# Patient Record
Sex: Female | Born: 1970 | Race: White | Hispanic: No | Marital: Married | Smoking: Never smoker
Health system: Southern US, Community
[De-identification: ages and names within clinical notes are randomized; demographics above are authoritative.]

## PROBLEM LIST (undated history)

## (undated) DIAGNOSIS — K219 Gastro-esophageal reflux disease without esophagitis: Secondary | ICD-10-CM

---

## 2007-10-30 ENCOUNTER — Other Ambulatory Visit: Admission: RE | Admit: 2007-10-30 | Discharge: 2007-10-30 | Payer: Self-pay | Admitting: Family Medicine

## 2009-05-17 ENCOUNTER — Encounter: Admission: RE | Admit: 2009-05-17 | Discharge: 2009-05-17 | Payer: Self-pay | Admitting: Obstetrics and Gynecology

## 2012-07-27 ENCOUNTER — Other Ambulatory Visit: Payer: Self-pay | Admitting: Obstetrics and Gynecology

## 2012-07-27 DIAGNOSIS — Z1231 Encounter for screening mammogram for malignant neoplasm of breast: Secondary | ICD-10-CM

## 2012-08-10 ENCOUNTER — Ambulatory Visit
Admission: RE | Admit: 2012-08-10 | Discharge: 2012-08-10 | Disposition: A | Discharge status: Home / Self Care | Payer: Managed Care, Other (non HMO) | Source: Ambulatory Visit | Attending: Obstetrics and Gynecology | Admitting: Obstetrics and Gynecology

## 2012-08-10 DIAGNOSIS — Z1231 Encounter for screening mammogram for malignant neoplasm of breast: Secondary | ICD-10-CM

## 2012-08-12 ENCOUNTER — Other Ambulatory Visit: Payer: Self-pay | Admitting: Obstetrics and Gynecology

## 2012-08-12 DIAGNOSIS — R928 Other abnormal and inconclusive findings on diagnostic imaging of breast: Secondary | ICD-10-CM

## 2012-08-19 ENCOUNTER — Other Ambulatory Visit: Payer: Self-pay | Admitting: Obstetrics and Gynecology

## 2012-08-19 ENCOUNTER — Ambulatory Visit
Admission: RE | Admit: 2012-08-19 | Discharge: 2012-08-19 | Disposition: A | Discharge status: Home / Self Care | Payer: Managed Care, Other (non HMO) | Source: Ambulatory Visit | Attending: Obstetrics and Gynecology | Admitting: Obstetrics and Gynecology

## 2012-08-19 DIAGNOSIS — R928 Other abnormal and inconclusive findings on diagnostic imaging of breast: Secondary | ICD-10-CM

## 2012-08-24 ENCOUNTER — Ambulatory Visit
Admission: RE | Admit: 2012-08-24 | Discharge: 2012-08-24 | Disposition: A | Discharge status: Home / Self Care | Payer: Managed Care, Other (non HMO) | Source: Ambulatory Visit | Attending: Obstetrics and Gynecology | Admitting: Obstetrics and Gynecology

## 2012-08-24 ENCOUNTER — Other Ambulatory Visit: Payer: Self-pay | Admitting: Obstetrics and Gynecology

## 2012-08-24 DIAGNOSIS — R928 Other abnormal and inconclusive findings on diagnostic imaging of breast: Secondary | ICD-10-CM

## 2012-08-24 DIAGNOSIS — R921 Mammographic calcification found on diagnostic imaging of breast: Secondary | ICD-10-CM

## 2013-09-27 ENCOUNTER — Other Ambulatory Visit: Payer: Self-pay

## 2013-09-27 DIAGNOSIS — Z1231 Encounter for screening mammogram for malignant neoplasm of breast: Secondary | ICD-10-CM

## 2013-10-04 ENCOUNTER — Ambulatory Visit
Admission: RE | Admit: 2013-10-04 | Discharge: 2013-10-04 | Disposition: A | Discharge status: Home / Self Care | Payer: Managed Care, Other (non HMO) | Source: Ambulatory Visit

## 2013-10-04 DIAGNOSIS — Z1231 Encounter for screening mammogram for malignant neoplasm of breast: Secondary | ICD-10-CM

## 2015-10-23 ENCOUNTER — Other Ambulatory Visit: Payer: Self-pay

## 2015-10-23 DIAGNOSIS — Z1231 Encounter for screening mammogram for malignant neoplasm of breast: Secondary | ICD-10-CM

## 2015-10-26 ENCOUNTER — Ambulatory Visit
Admission: RE | Admit: 2015-10-26 | Discharge: 2015-10-26 | Disposition: A | Discharge status: Home / Self Care | Payer: Managed Care, Other (non HMO) | Source: Ambulatory Visit

## 2015-10-26 DIAGNOSIS — Z1231 Encounter for screening mammogram for malignant neoplasm of breast: Secondary | ICD-10-CM

## 2015-11-01 ENCOUNTER — Other Ambulatory Visit: Payer: Self-pay | Admitting: Obstetrics and Gynecology

## 2015-11-01 DIAGNOSIS — R928 Other abnormal and inconclusive findings on diagnostic imaging of breast: Secondary | ICD-10-CM

## 2015-11-05 HISTORY — PX: BREAST BIOPSY: SHX20

## 2015-11-07 ENCOUNTER — Other Ambulatory Visit: Payer: Self-pay | Admitting: Obstetrics and Gynecology

## 2015-11-07 ENCOUNTER — Ambulatory Visit
Admission: RE | Admit: 2015-11-07 | Discharge: 2015-11-07 | Disposition: A | Discharge status: Home / Self Care | Payer: Managed Care, Other (non HMO) | Source: Ambulatory Visit | Attending: Obstetrics and Gynecology | Admitting: Obstetrics and Gynecology

## 2015-11-07 DIAGNOSIS — R928 Other abnormal and inconclusive findings on diagnostic imaging of breast: Secondary | ICD-10-CM

## 2015-11-09 ENCOUNTER — Other Ambulatory Visit: Payer: Self-pay | Admitting: Obstetrics and Gynecology

## 2015-11-09 DIAGNOSIS — R928 Other abnormal and inconclusive findings on diagnostic imaging of breast: Secondary | ICD-10-CM

## 2015-11-10 ENCOUNTER — Ambulatory Visit
Admission: RE | Admit: 2015-11-10 | Discharge: 2015-11-10 | Disposition: A | Discharge status: Home / Self Care | Payer: Managed Care, Other (non HMO) | Source: Ambulatory Visit | Attending: Obstetrics and Gynecology | Admitting: Obstetrics and Gynecology

## 2015-11-10 DIAGNOSIS — R928 Other abnormal and inconclusive findings on diagnostic imaging of breast: Secondary | ICD-10-CM

## 2017-04-29 ENCOUNTER — Other Ambulatory Visit: Payer: Self-pay | Admitting: Obstetrics and Gynecology

## 2017-04-29 DIAGNOSIS — Z1231 Encounter for screening mammogram for malignant neoplasm of breast: Secondary | ICD-10-CM

## 2017-05-06 ENCOUNTER — Ambulatory Visit
Admission: RE | Admit: 2017-05-06 | Discharge: 2017-05-06 | Disposition: A | Discharge status: Home / Self Care | Payer: Commercial Managed Care - PPO | Source: Ambulatory Visit | Attending: Obstetrics and Gynecology | Admitting: Obstetrics and Gynecology

## 2017-05-06 DIAGNOSIS — Z1231 Encounter for screening mammogram for malignant neoplasm of breast: Secondary | ICD-10-CM

## 2018-05-14 DIAGNOSIS — R635 Abnormal weight gain: Secondary | ICD-10-CM | POA: Insufficient documentation

## 2018-05-14 DIAGNOSIS — E782 Mixed hyperlipidemia: Secondary | ICD-10-CM | POA: Insufficient documentation

## 2018-06-03 ENCOUNTER — Other Ambulatory Visit: Payer: Self-pay | Admitting: Obstetrics and Gynecology

## 2018-06-03 DIAGNOSIS — Z1231 Encounter for screening mammogram for malignant neoplasm of breast: Secondary | ICD-10-CM

## 2018-06-26 ENCOUNTER — Ambulatory Visit
Admission: RE | Admit: 2018-06-26 | Discharge: 2018-06-26 | Disposition: A | Discharge status: Home / Self Care | Payer: Commercial Managed Care - PPO | Source: Ambulatory Visit | Attending: Obstetrics and Gynecology | Admitting: Obstetrics and Gynecology

## 2018-06-26 DIAGNOSIS — Z1231 Encounter for screening mammogram for malignant neoplasm of breast: Secondary | ICD-10-CM

## 2018-07-30 DIAGNOSIS — R1310 Dysphagia, unspecified: Secondary | ICD-10-CM | POA: Insufficient documentation

## 2018-07-30 DIAGNOSIS — R5383 Other fatigue: Secondary | ICD-10-CM | POA: Insufficient documentation

## 2018-09-30 DIAGNOSIS — E6609 Other obesity due to excess calories: Secondary | ICD-10-CM | POA: Insufficient documentation

## 2019-06-28 ENCOUNTER — Encounter (HOSPITAL_BASED_OUTPATIENT_CLINIC_OR_DEPARTMENT_OTHER): Payer: Self-pay | Admitting: *Deleted

## 2019-06-28 ENCOUNTER — Emergency Department (HOSPITAL_BASED_OUTPATIENT_CLINIC_OR_DEPARTMENT_OTHER)
Admission: EM | Admit: 2019-06-28 | Discharge: 2019-06-28 | Disposition: A | Discharge status: Home / Self Care | Payer: Commercial Managed Care - PPO | Attending: Emergency Medicine | Admitting: Emergency Medicine

## 2019-06-28 ENCOUNTER — Other Ambulatory Visit: Payer: Self-pay

## 2019-06-28 ENCOUNTER — Emergency Department (HOSPITAL_BASED_OUTPATIENT_CLINIC_OR_DEPARTMENT_OTHER): Payer: Commercial Managed Care - PPO

## 2019-06-28 DIAGNOSIS — N83202 Unspecified ovarian cyst, left side: Secondary | ICD-10-CM | POA: Diagnosis not present

## 2019-06-28 DIAGNOSIS — R1032 Left lower quadrant pain: Secondary | ICD-10-CM | POA: Diagnosis present

## 2019-06-28 LAB — URINALYSIS, ROUTINE W REFLEX MICROSCOPIC
Bilirubin Urine: NEGATIVE
Glucose, UA: NEGATIVE mg/dL
Hgb urine dipstick: NEGATIVE
Ketones, ur: NEGATIVE mg/dL
Nitrite: NEGATIVE
Protein, ur: NEGATIVE mg/dL
Specific Gravity, Urine: 1.02 (ref 1.005–1.030)
pH: 7.5 (ref 5.0–8.0)

## 2019-06-28 LAB — BASIC METABOLIC PANEL
Anion gap: 10 (ref 5–15)
BUN: 13 mg/dL (ref 6–20)
CO2: 25 mmol/L (ref 22–32)
Calcium: 9.4 mg/dL (ref 8.9–10.3)
Chloride: 103 mmol/L (ref 98–111)
Creatinine, Ser: 0.68 mg/dL (ref 0.44–1.00)
GFR calc Af Amer: >60 mL/min (ref >60)
GFR calc non Af Amer: >60 mL/min (ref >60)
Glucose, Bld: 98 mg/dL (ref 70–99)
Potassium: 3.8 mmol/L (ref 3.5–5.1)
Sodium: 138 mmol/L (ref 135–145)

## 2019-06-28 LAB — CBC WITH DIFFERENTIAL/PLATELET
Abs Immature Granulocytes: 0.05 10*3/uL (ref 0.00–0.07)
Basophils Absolute: 0.1 10*3/uL (ref 0.0–0.1)
Basophils Relative: 1 %
Eosinophils Absolute: 0.1 10*3/uL (ref 0.0–0.5)
Eosinophils Relative: 1 %
HCT: 43.3 % (ref 36.0–46.0)
Hemoglobin: 14.4 g/dL (ref 12.0–15.0)
Immature Granulocytes: 1 %
Lymphocytes Relative: 17 %
Lymphs Abs: 1.9 10*3/uL (ref 0.7–4.0)
MCH: 29.9 pg (ref 26.0–34.0)
MCHC: 33.3 g/dL (ref 30.0–36.0)
MCV: 90 fL (ref 80.0–100.0)
Monocytes Absolute: 0.5 10*3/uL (ref 0.1–1.0)
Monocytes Relative: 5 %
Neutro Abs: 8.1 10*3/uL — ABNORMAL HIGH (ref 1.7–7.7)
Neutrophils Relative %: 75 %
Platelets: 321 10*3/uL (ref 150–400)
RBC: 4.81 MIL/uL (ref 3.87–5.11)
RDW: 12.8 % (ref 11.5–15.5)
WBC: 10.8 10*3/uL — ABNORMAL HIGH (ref 4.0–10.5)
nRBC: 0 % (ref 0.0–0.2)

## 2019-06-28 LAB — URINALYSIS, MICROSCOPIC (REFLEX)

## 2019-06-28 LAB — HCG, SERUM, QUALITATIVE: Preg, Serum: NEGATIVE

## 2019-06-28 MED ORDER — OXYCODONE-ACETAMINOPHEN 5-325 MG PO TABS
2.0000 | ORAL_TABLET | Freq: Once | ORAL | Status: AC
  Administered 2019-06-28: 2 via ORAL
  Filled 2019-06-28: qty 2

## 2019-06-28 MED ORDER — IOHEXOL 300 MG/ML  SOLN
100.0000 mL | Freq: Once | INTRAMUSCULAR | Status: AC | PRN
  Administered 2019-06-28: 100 mL via INTRAVENOUS

## 2019-06-28 MED ORDER — KETOROLAC TROMETHAMINE 30 MG/ML IJ SOLN
30.0000 mg | Freq: Once | INTRAMUSCULAR | Status: AC
  Administered 2019-06-28: 30 mg via INTRAVENOUS
  Filled 2019-06-28: qty 1

## 2019-06-28 MED ORDER — MORPHINE SULFATE (PF) 4 MG/ML IV SOLN
4.0000 mg | Freq: Once | INTRAVENOUS | Status: AC
  Administered 2019-06-28: 4 mg via INTRAVENOUS
  Filled 2019-06-28: qty 1

## 2019-06-28 MED ORDER — IBUPROFEN 800 MG PO TABS: 800.0000 mg | ORAL_TABLET | Freq: Three times a day (TID) | ORAL | 0 refills | Status: DC

## 2019-06-28 MED ORDER — SODIUM CHLORIDE 0.9 % IV SOLN
Freq: Once | INTRAVENOUS | Status: AC
  Administered 2019-06-28: 22:00:00 via INTRAVENOUS

## 2019-06-28 MED ORDER — OXYCODONE-ACETAMINOPHEN 5-325 MG PO TABS: 1.0000 | ORAL_TABLET | Freq: Four times a day (QID) | ORAL | 0 refills | Status: DC | PRN

## 2019-06-28 NOTE — ED Provider Notes (Signed)
[facility] EMERGENCY DEPARTMENT Provider Note   CSN: [id number] Arrival date & time: 06/28/19  1805     History   Chief Complaint Chief Complaint  Patient presents with   Abdominal Pain    HPI Rhonda Crawford is a 48 y.o. female.     HPI Patient started have left lower abdominal pain this morning.  She reports that at first it was not quite as severe.  However over the course of the day it became much more intense.  It started radiating in her lower left abdomen.  She reports by the time she was driving home from work she was in severe pain.  She reports it made her feel nauseated.  She has not had any diarrhea.  No vomiting.  No abnormal vaginal discharge or bleeding.  Patient denies any history of similar pain.  She felt well before onset. History reviewed. No pertinent past medical history.  There are no active problems to display for this patient.   History reviewed. No pertinent surgical history.   OB History   No obstetric history on file.      Home Medications    Prior to Admission medications   Medication Sig Start Date End Date Taking? Authorizing Provider  ibuprofen (ADVIL) 800 MG tablet Take 1 tablet (800 mg total) by mouth 3 (three) times daily. 06/28/19   Arby BarrettePfeiffer, Roselinda Bahena, MD  omeprazole (PRILOSEC) 20 MG capsule Take by mouth.    [provider]  oxyCODONE-acetaminophen (PERCOCET) 5-325 MG tablet Take 1-2 tablets by mouth every 6 (six) hours as needed. 06/28/19   Arby BarrettePfeiffer, Crawford Tamura, MD    Family History Family History  Problem Relation Age of Onset   Breast cancer Neg Hx     Social History Social History   Tobacco Use   Smoking status: Never Smoker   Smokeless tobacco: Never Used  Substance Use Topics   Alcohol use: Never    Frequency: Never   Drug use: Never     Allergies   Patient has no known allergies.   Review of Systems Review of Systems 10 Systems reviewed and are negative for acute change except as noted in  the HPI.   Physical Exam Updated Vital Signs BP (!) 131/93    Pulse 70    Temp 98.4 F (36.9 C) (Oral)    Resp 16    Ht 5\' 5"  (1.651 m)    Wt 94.8 kg    SpO2 97%    BMI 34.78 kg/m   Physical Exam Constitutional:      Comments: Patient is alert and nontoxic.  She appears uncomfortable.  Mental status clear.  HENT:     Head: Normocephalic and atraumatic.  Eyes:     Extraocular Movements: Extraocular movements intact.     Conjunctiva/sclera: Conjunctivae normal.  Cardiovascular:     Rate and Rhythm: Normal rate and regular rhythm.  Pulmonary:     Effort: Pulmonary effort is normal.     Breath sounds: Normal breath sounds.  Abdominal:     Comments: Abdomen is soft.  Moderate to severe left lower quadrant pain to palpation.  No CVA tenderness.  No guarding.  Musculoskeletal: Normal range of motion.        General: No swelling or tenderness.     Right lower leg: No edema.     Left lower leg: No edema.  Skin:    General: Skin is warm and dry.  Neurological:     General: No focal deficit present.  Mental Status: She is oriented to person, place, and time.     Coordination: Coordination normal.  Psychiatric:        Mood and Affect: Mood normal.      ED Treatments / Results  Labs (all labs ordered are listed, but only abnormal results are displayed) Labs Reviewed  URINALYSIS, ROUTINE W REFLEX MICROSCOPIC - Abnormal; Notable for the following components:      Result Value   APPearance HAZY (*)    Leukocytes,Ua MODERATE (*)    All other components within normal limits  URINALYSIS, MICROSCOPIC (REFLEX) - Abnormal; Notable for the following components:   Bacteria, UA FEW (*)    All other components within normal limits  CBC WITH DIFFERENTIAL/PLATELET - Abnormal; Notable for the following components:   WBC 10.8 (*)    Neutro Abs 8.1 (*)    All other components within normal limits  BASIC METABOLIC PANEL  HCG, SERUM, QUALITATIVE    EKG None  Radiology Ct Abdomen  Pelvis W Contrast  Result Date: 06/28/2019 CLINICAL DATA:  48 year old female with left flank pain. EXAM: CT ABDOMEN AND PELVIS WITH CONTRAST TECHNIQUE: Multidetector CT imaging of the abdomen and pelvis was performed using the standard protocol following bolus administration of intravenous contrast. CONTRAST:  100mL OMNIPAQUE IOHEXOL 300 MG/ML  SOLN COMPARISON:  CT of the abdomen pelvis dated 07/26/2008 FINDINGS: Lower chest: The visualized lung bases are clear. No intra-abdominal free air. There is trace free fluid within the pelvis. Hepatobiliary: No focal liver abnormality is seen. No gallstones, gallbladder wall thickening, or biliary dilatation. Pancreas: Unremarkable. No pancreatic ductal dilatation or surrounding inflammatory changes. Spleen: Normal in size without focal abnormality. Adrenals/Urinary Tract: Adrenal glands are unremarkable. Kidneys are normal, without renal calculi, focal lesion, or hydronephrosis. Bladder is unremarkable. Stomach/Bowel: There is moderate stool throughout the colon. There is no bowel obstruction or active inflammation. The appendix is normal. Vascular/Lymphatic: Minimal atherosclerotic calcification of the aorta. The abdominal aorta and IVC are otherwise unremarkable. No portal venous gas. There is no adenopathy. Reproductive: The uterus is anteverted and appears unremarkable. An intrauterine device is noted. Left ovarian follicles/cysts measure up to 4 cm. There is faint apparent layering of high attenuation along the dependent portion of the dominant follicle, likely blood product or proteinaceous debris. Further evaluation with pelvic ultrasound recommended. The right ovary is grossly unremarkable as visualized. Other: No abdominal wall hernia or abnormality. No abdominopelvic ascites. Musculoskeletal: No acute or significant osseous findings. IMPRESSION: 1. Left ovarian dominant follicle/cyst with probable hemorrhagic or proteinaceous content. Further evaluation pelvic  ultrasound recommended. 2. No bowel obstruction or active inflammation. Normal appendix. 3. Aortic Atherosclerosis (ICD10-I70.0). Electronically Signed   By: Elgie CollardArash  Radparvar M.D.   On: 06/28/2019 22:39    Procedures Procedures (including critical care time)  Medications Ordered in ED Medications  oxyCODONE-acetaminophen (PERCOCET/ROXICET) 5-325 MG per tablet 2 tablet (has no administration in time range)  ketorolac (TORADOL) 30 MG/ML injection 30 mg (30 mg Intravenous Given 06/28/19 2139)  0.9 %  sodium chloride infusion ( Intravenous New Bag/Given 06/28/19 2144)  morphine 4 MG/ML injection 4 mg (4 mg Intravenous Given 06/28/19 2139)  iohexol (OMNIPAQUE) 300 MG/ML solution 100 mL (100 mLs Intravenous Contrast Given 06/28/19 2209)     Initial Impression / Assessment and Plan / ED Course  I have reviewed the triage vital signs and the nursing notes.  Pertinent labs & imaging results that were available during my care of the patient were reviewed by me and considered in my  medical decision making (see chart for details).        Patient had left lower quadrant pain that started in the morning.  Over the course of the day it continued to worsen.  By the time of patient's arrival she was very tender to palpation.  Nausea associated with pain but no significant nausea or vomiting.  She has not had vaginal discharge or bleeding.  Patient reports she does have a Mirena IUD and therefore does not have regular menstrual cycles.  CT scan has identified a left ovarian cyst.  This is consistent with patient's pain presentation.  She has much better pain controlled after Toradol and morphine.  She is otherwise clinically well in appearance.  We will plan to continue treatment for pain.  She is advised to call her GYN physician tomorrow to schedule follow-up within the next 1 to 2 days.  Return precautions reviewed.  Final Clinical Impressions(s) / ED Diagnoses   Final diagnoses:  Cyst of left ovary     ED Discharge Orders         Ordered    ibuprofen (ADVIL) 800 MG tablet  3 times daily     06/28/19 2307    oxyCODONE-acetaminophen (PERCOCET) 5-325 MG tablet  Every 6 hours PRN     06/28/19 2307           Charlesetta Shanks, MD 06/28/19 2312

## 2019-06-28 NOTE — ED Notes (Signed)
Pt. Reports pain started suddenly in the L flank and back area.  Pt. Reports nausea with pain later today.  Pt. Said pain has been coming and going.

## 2019-06-28 NOTE — Discharge Instructions (Signed)
1.  Call your gynecologist in the morning to schedule recheck within the next 1 to 3 days. 2.  Take ibuprofen for pain.  If you need additional pain control you may take 1-2 Percocet every 6 hours. 3.  Return to the emergency department if you develop fever, worsening pain or other concerning symptoms.

## 2019-06-28 NOTE — ED Triage Notes (Signed)
Abdominal pain since this am. Pain is in her left lower quadrant with radiation into her flank.

## 2019-07-06 ENCOUNTER — Emergency Department (HOSPITAL_BASED_OUTPATIENT_CLINIC_OR_DEPARTMENT_OTHER): Payer: Commercial Managed Care - PPO

## 2019-07-06 ENCOUNTER — Other Ambulatory Visit: Payer: Self-pay

## 2019-07-06 ENCOUNTER — Emergency Department (HOSPITAL_BASED_OUTPATIENT_CLINIC_OR_DEPARTMENT_OTHER)
Admission: EM | Admit: 2019-07-06 | Discharge: 2019-07-06 | Disposition: A | Discharge status: Home / Self Care | Payer: Commercial Managed Care - PPO | Attending: Emergency Medicine | Admitting: Emergency Medicine

## 2019-07-06 DIAGNOSIS — R1012 Left upper quadrant pain: Secondary | ICD-10-CM | POA: Diagnosis present

## 2019-07-06 LAB — CBC WITH DIFFERENTIAL/PLATELET
Abs Immature Granulocytes: 0.06 10*3/uL (ref 0.00–0.07)
Basophils Absolute: 0.1 10*3/uL (ref 0.0–0.1)
Basophils Relative: 1 %
Eosinophils Absolute: 0.1 10*3/uL (ref 0.0–0.5)
Eosinophils Relative: 1 %
HCT: 43.4 % (ref 36.0–46.0)
Hemoglobin: 14.4 g/dL (ref 12.0–15.0)
Immature Granulocytes: 1 %
Lymphocytes Relative: 20 %
Lymphs Abs: 2.4 10*3/uL (ref 0.7–4.0)
MCH: 29.9 pg (ref 26.0–34.0)
MCHC: 33.2 g/dL (ref 30.0–36.0)
MCV: 90.2 fL (ref 80.0–100.0)
Monocytes Absolute: 0.6 10*3/uL (ref 0.1–1.0)
Monocytes Relative: 5 %
Neutro Abs: 8.7 10*3/uL — ABNORMAL HIGH (ref 1.7–7.7)
Neutrophils Relative %: 72 %
Platelets: 332 10*3/uL (ref 150–400)
RBC: 4.81 MIL/uL (ref 3.87–5.11)
RDW: 12.9 % (ref 11.5–15.5)
WBC: 11.9 10*3/uL — ABNORMAL HIGH (ref 4.0–10.5)
nRBC: 0 % (ref 0.0–0.2)

## 2019-07-06 LAB — COMPREHENSIVE METABOLIC PANEL
ALT: 25 U/L (ref 0–44)
AST: 22 U/L (ref 15–41)
Albumin: 4.4 g/dL (ref 3.5–5.0)
Alkaline Phosphatase: 54 U/L (ref 38–126)
Anion gap: 10 (ref 5–15)
BUN: 14 mg/dL (ref 6–20)
CO2: 25 mmol/L (ref 22–32)
Calcium: 9.3 mg/dL (ref 8.9–10.3)
Chloride: 102 mmol/L (ref 98–111)
Creatinine, Ser: 0.74 mg/dL (ref 0.44–1.00)
GFR calc Af Amer: >60 mL/min (ref >60)
GFR calc non Af Amer: >60 mL/min (ref >60)
Glucose, Bld: 101 mg/dL — ABNORMAL HIGH (ref 70–99)
Potassium: 3.7 mmol/L (ref 3.5–5.1)
Sodium: 137 mmol/L (ref 135–145)
Total Bilirubin: 0.5 mg/dL (ref 0.3–1.2)
Total Protein: 7.8 g/dL (ref 6.5–8.1)

## 2019-07-06 LAB — LIPASE, BLOOD: Lipase: 27 U/L (ref 11–51)

## 2019-07-06 MED ORDER — KETOROLAC TROMETHAMINE 15 MG/ML IJ SOLN
15.0000 mg | Freq: Once | INTRAMUSCULAR | Status: AC
  Administered 2019-07-06: 21:00:00 15 mg via INTRAVENOUS
  Filled 2019-07-06: qty 1

## 2019-07-06 MED ORDER — MORPHINE SULFATE (PF) 4 MG/ML IV SOLN
4.0000 mg | Freq: Once | INTRAVENOUS | Status: AC
  Administered 2019-07-06: 21:00:00 4 mg via INTRAVENOUS
  Filled 2019-07-06: qty 1

## 2019-07-06 MED ORDER — DICYCLOMINE HCL 10 MG PO CAPS
10.0000 mg | ORAL_CAPSULE | Freq: Once | ORAL | Status: AC
  Administered 2019-07-06: 23:00:00 10 mg via ORAL
  Filled 2019-07-06: qty 1

## 2019-07-06 MED ORDER — SODIUM CHLORIDE 0.9 % IV BOLUS
1000.0000 mL | Freq: Once | INTRAVENOUS | Status: AC
  Administered 2019-07-06: 21:00:00 1000 mL via INTRAVENOUS

## 2019-07-06 MED ORDER — ONDANSETRON HCL 4 MG/2ML IJ SOLN
4.0000 mg | Freq: Once | INTRAMUSCULAR | Status: AC
  Administered 2019-07-06: 21:00:00 4 mg via INTRAVENOUS
  Filled 2019-07-06: qty 2

## 2019-07-06 MED ORDER — DICYCLOMINE HCL 20 MG PO TABS: 20.0000 mg | ORAL_TABLET | Freq: Two times a day (BID) | ORAL | 0 refills | Status: DC

## 2019-07-06 MED ORDER — FAMOTIDINE IN NACL 20-0.9 MG/50ML-% IV SOLN
20.0000 mg | Freq: Once | INTRAVENOUS | Status: AC
  Administered 2019-07-06: 21:00:00 20 mg via INTRAVENOUS
  Filled 2019-07-06: qty 50

## 2019-07-06 MED ORDER — MORPHINE SULFATE (PF) 4 MG/ML IV SOLN
4.0000 mg | Freq: Once | INTRAVENOUS | Status: AC
  Administered 2019-07-06: 4 mg via INTRAVENOUS
  Filled 2019-07-06: qty 1

## 2019-07-06 NOTE — ED Triage Notes (Signed)
Pt c/o left upper abd pain x 2 weeks , NEg [place] today , seen here lats week for same , sent here by PMD today

## 2019-07-06 NOTE — ED Provider Notes (Signed)
[facility] EMERGENCY DEPARTMENT Provider Note   CSN: [id number] Arrival date & time: 07/06/19  1829     History   Chief Complaint Chief Complaint  Patient presents with  . Abdominal Pain    HPI Rhonda Crawford is a 48 y.o. female.     48 yo F with a chief complaints of left-sided abdominal pain.  Going on for about 9 days now.  Pain initially started in the left lower quadrant and now has migrated to the left upper quadrant.  She has had some nausea with this.  Has had some taste of stomach acid in the back of her throat.  She is seen multiple providers for this.  Has been to urgent care in the ED now twice.  Saw her OB/GYN after she was initially diagnosed with a left ovarian cyst and had an outpatient ultrasound that was done today.  The results of that test were benign per the patient.  States that this cyst was visualized with smaller than initially thought.  She felt that her pain has been worsening and so she called her family doctor who had called her in some medicines to the pharmacy.  However because her pain is gotten worse she came to the ED again for evaluation.  Pain now is worse in the left upper quadrant.  Seems to come and go.  Nothing seems to make it better or worse.  Pain radiates to her back as well.  The history is provided by the patient.  Abdominal Pain Pain location:  LUQ Pain quality: cramping, sharp and shooting   Pain radiates to:  LUQ Pain severity:  Severe Onset quality:  Gradual Duration:  9 days Timing:  Intermittent Progression:  Waxing and waning Chronicity:  New Relieved by:  Nothing Worsened by:  Nothing Ineffective treatments:  None tried Associated symptoms: nausea and vomiting   Associated symptoms: no chest pain, no chills, no dysuria, no fever and no shortness of breath     No past medical history on file.  There are no active problems to display for this patient.   No past surgical history on file.   OB History   No  obstetric history on file.      Home Medications    Prior to Admission medications   Medication Sig Start Date End Date Taking? Authorizing Provider  dicyclomine (BENTYL) 20 MG tablet Take 1 tablet (20 mg total) by mouth 2 (two) times daily. 07/06/19   Melene PlanFloyd, Kynzli Rease, DO  ibuprofen (ADVIL) 800 MG tablet Take 1 tablet (800 mg total) by mouth 3 (three) times daily. 06/28/19   Arby BarrettePfeiffer, Marcy, MD  omeprazole (PRILOSEC) 20 MG capsule Take by mouth.    [provider]  oxyCODONE-acetaminophen (PERCOCET) 5-325 MG tablet Take 1-2 tablets by mouth every 6 (six) hours as needed. 06/28/19   Arby BarrettePfeiffer, Marcy, MD    Family History Family History  Problem Relation Age of Onset  . Breast cancer Neg Hx     Social History Social History   Tobacco Use  . Smoking status: Never Smoker  . Smokeless tobacco: Never Used  Substance Use Topics  . Alcohol use: Never    Frequency: Never  . Drug use: Never     Allergies   Patient has no known allergies.   Review of Systems Review of Systems  Constitutional: Negative for chills and fever.  HENT: Negative for congestion and rhinorrhea.   Eyes: Negative for redness and visual disturbance.  Respiratory: Negative for  shortness of breath and wheezing.   Cardiovascular: Negative for chest pain and palpitations.  Gastrointestinal: Positive for abdominal pain, nausea and vomiting.  Genitourinary: Negative for dysuria and urgency.  Musculoskeletal: Negative for arthralgias and myalgias.  Skin: Negative for pallor and wound.  Neurological: Negative for dizziness and headaches.     Physical Exam Updated Vital Signs BP 125/86 (BP Location: Right Arm)   Pulse 60   Temp 98.4 F (36.9 C) (Oral)   Resp 16   Ht 5\' 5"  (1.651 m)   Wt 94 kg   SpO2 99%   BMI 34.49 kg/m   Physical Exam Vitals signs and nursing note reviewed.  Constitutional:      General: She is not in acute distress.    Appearance: She is well-developed. She is not diaphoretic.   HENT:     Head: Normocephalic and atraumatic.  Eyes:     Pupils: Pupils are equal, round, and reactive to light.  Neck:     Musculoskeletal: Normal range of motion and neck supple.  Cardiovascular:     Rate and Rhythm: Normal rate and regular rhythm.     Heart sounds: No murmur. No friction rub. No gallop.   Pulmonary:     Effort: Pulmonary effort is normal.     Breath sounds: No wheezing or rales.  Abdominal:     General: There is no distension.     Palpations: Abdomen is soft.     Tenderness: There is abdominal tenderness.     Comments: She has some mild discomfort in the right upper quadrant.  Pain is worse in the left upper quadrant.  Mild tenderness left lower quadrant.  Musculoskeletal:        General: No tenderness.  Skin:    General: Skin is warm and dry.  Neurological:     Mental Status: She is alert and oriented to person, place, and time.  Psychiatric:        Behavior: Behavior normal.      ED Treatments / Results  Labs (all labs ordered are listed, but only abnormal results are displayed) Labs Reviewed  CBC WITH DIFFERENTIAL/PLATELET - Abnormal; Notable for the following components:      Result Value   WBC 11.9 (*)    Neutro Abs 8.7 (*)    All other components within normal limits  COMPREHENSIVE METABOLIC PANEL - Abnormal; Notable for the following components:   Glucose, Bld 101 (*)    All other components within normal limits  LIPASE, BLOOD    EKG None  Radiology Ct Renal Stone Study  Result Date: 07/06/2019 CLINICAL DATA:  48 year old female with acute abdominal and flank pain. EXAM: CT ABDOMEN AND PELVIS WITHOUT CONTRAST TECHNIQUE: Multidetector CT imaging of the abdomen and pelvis was performed following the standard protocol without IV contrast. COMPARISON:  06/28/2019 CT FINDINGS: Please note that parenchymal abnormalities may be missed without intravenous contrast. Lower chest: No acute abnormality. Hepatobiliary: The liver and gallbladder are  unremarkable. No biliary dilatation. Pancreas: Unremarkable Spleen: Unremarkable Adrenals/Urinary Tract: The kidneys, adrenal glands and bladder are unremarkable. Stomach/Bowel: Stomach is within normal limits. Appendix appears normal. No evidence of bowel wall thickening, distention, or inflammatory changes. Vascular/Lymphatic: Aortic atherosclerosis. No enlarged abdominal or pelvic lymph nodes. Reproductive: A cystic tubular structure in the LEFT adnexal region again noted probably represents a hydrosalpinx or possibly ovarian cysts. Consider ultrasound if symptoms are referable this area. An IUD is noted. A trace amount of free pelvic fluid is noted. Other: No pneumoperitoneum  or abdominal wall hernia. Musculoskeletal: No acute or suspicious bony abnormalities. IMPRESSION: 1. Cystic tubular structure in the LEFT adnexal region again noted probably representing hydrosalpinx or less likely ovarian cysts. Consider pelvic ultrasound if symptoms are referable in this area. 2.  Aortic Atherosclerosis (ICD10-I70.0). Electronically Signed   By: Margarette Canada M.D.   On: 07/06/2019 21:40    Procedures Procedures (including critical care time)  Medications Ordered in ED Medications  sodium chloride 0.9 % bolus 1,000 mL (0 mLs Intravenous Stopped 07/06/19 2205)  ondansetron (ZOFRAN) injection 4 mg (4 mg Intravenous Given 07/06/19 2038)  morphine 4 MG/ML injection 4 mg (4 mg Intravenous Given 07/06/19 2039)  famotidine (PEPCID) IVPB 20 mg premix (0 mg Intravenous Stopped 07/06/19 2205)  ketorolac (TORADOL) 15 MG/ML injection 15 mg (15 mg Intravenous Given 07/06/19 2119)  morphine 4 MG/ML injection 4 mg (4 mg Intravenous Given 07/06/19 2119)  dicyclomine (BENTYL) capsule 10 mg (10 mg Oral Given 07/06/19 2233)     Initial Impression / Assessment and Plan / ED Course  I have reviewed the triage vital signs and the nursing notes.  Pertinent labs & imaging results that were available during my care of the patient were  reviewed by me and considered in my medical decision making (see chart for details).        48 yo F with a chief complaints of left-sided abdominal pain.  Had been seen in the ED couple days ago and had a CT scan that showed a ovarian cyst.  She had an ultrasound done today that showed that it was not complicated.  Her pain is gotten worse.  She had a couple episodes while she was in the room.  Seems like colonic pain as it seems to come and go and in the left upper quadrant.  There is some concern by the patient and the family  physician that maybe this was due to gallbladder pathology.  Will obtain lab work if the liver function tests are unremarkable and I feel the yield of right upper quadrant ultrasound will be low and I will obtain a stone study to evaluate if this is potentially kidney stones.  CT scan unfortunately shows no significant pathology.  It does show the known left-sided ovarian cyst.  There is no signs of rupture that would cause her the have left upper quadrant abdominal pain.  She continues to have colicky pain since she has been here.  Not significantly improved with morphine or Pepcid.  I will start the patient on Bentyl.  We will have her do a trial of MiraLAX for possible constipation.  PCP and GI follow-up.  10:34 PM:  I have discussed the diagnosis/risks/treatment options with the patient and family and believe the pt to be eligible for discharge home to follow-up with PCP, GI. We also discussed returning to the ED immediately if new or worsening sx occur. We discussed the sx which are most concerning (e.g., sudden worsening pain, fever, inability to tolerate by mouth) that necessitate immediate return. Medications administered to the patient during their visit and any new prescriptions provided to the patient are listed below.  Medications given during this visit Medications  sodium chloride 0.9 % bolus 1,000 mL (0 mLs Intravenous Stopped 07/06/19 2205)  ondansetron (ZOFRAN)  injection 4 mg (4 mg Intravenous Given 07/06/19 2038)  morphine 4 MG/ML injection 4 mg (4 mg Intravenous Given 07/06/19 2039)  famotidine (PEPCID) IVPB 20 mg premix (0 mg Intravenous Stopped 07/06/19 2205)  ketorolac (TORADOL)  15 MG/ML injection 15 mg (15 mg Intravenous Given 07/06/19 2119)  morphine 4 MG/ML injection 4 mg (4 mg Intravenous Given 07/06/19 2119)  dicyclomine (BENTYL) capsule 10 mg (10 mg Oral Given 07/06/19 2233)     The patient appears reasonably screen and/or stabilized for discharge and I doubt any other medical condition or other [facility] requiring further screening, evaluation, or treatment in the ED at this time prior to discharge.    Final Clinical Impressions(s) / ED Diagnoses   Final diagnoses:  Colicky LUQ abdominal pain    ED Discharge Orders         Ordered    dicyclomine (BENTYL) 20 MG tablet  2 times daily     07/06/19 2226           Melene PlanFloyd, Cherene Dobbins, DO 07/06/19 2234

## 2019-07-06 NOTE — Discharge Instructions (Signed)
Please follow-up with the gastroenterologist.  Take the medication as prescribed.  Take 8 scoops of miralax in 32oz of whatever you would like to drink.(Gatorade comes in this size) You can also use a fleets enema which you can buy over the counter at the pharmacy.  Return for worsening abdominal pain, vomiting or fever.  Try zantac or pepcid twice a day.  Try to avoid things that may make this worse, most commonly these are spicy foods tomato based products fatty foods chocolate and peppermint.  Alcohol and tobacco can also make this worse.  Return to the emergency department for sudden worsening pain fever or inability to eat or drink.

## 2019-08-31 ENCOUNTER — Other Ambulatory Visit: Payer: Self-pay | Admitting: Obstetrics and Gynecology

## 2019-08-31 DIAGNOSIS — Z1231 Encounter for screening mammogram for malignant neoplasm of breast: Secondary | ICD-10-CM

## 2019-10-19 ENCOUNTER — Other Ambulatory Visit: Payer: Self-pay

## 2019-10-19 ENCOUNTER — Ambulatory Visit
Admission: RE | Admit: 2019-10-19 | Discharge: 2019-10-19 | Disposition: A | Discharge status: Home / Self Care | Payer: Commercial Managed Care - PPO | Source: Ambulatory Visit | Attending: Obstetrics and Gynecology | Admitting: Obstetrics and Gynecology

## 2019-10-19 DIAGNOSIS — Z1231 Encounter for screening mammogram for malignant neoplasm of breast: Secondary | ICD-10-CM

## 2019-10-21 ENCOUNTER — Other Ambulatory Visit: Payer: Self-pay | Admitting: Obstetrics and Gynecology

## 2019-10-21 DIAGNOSIS — R928 Other abnormal and inconclusive findings on diagnostic imaging of breast: Secondary | ICD-10-CM

## 2019-10-27 ENCOUNTER — Other Ambulatory Visit: Payer: Self-pay

## 2019-10-27 ENCOUNTER — Ambulatory Visit
Admission: RE | Admit: 2019-10-27 | Discharge: 2019-10-27 | Disposition: A | Discharge status: Home / Self Care | Payer: Commercial Managed Care - PPO | Source: Ambulatory Visit | Attending: Obstetrics and Gynecology | Admitting: Obstetrics and Gynecology

## 2019-10-27 ENCOUNTER — Other Ambulatory Visit: Payer: Self-pay | Admitting: Obstetrics and Gynecology

## 2019-10-27 DIAGNOSIS — R921 Mammographic calcification found on diagnostic imaging of breast: Secondary | ICD-10-CM

## 2019-10-27 DIAGNOSIS — R928 Other abnormal and inconclusive findings on diagnostic imaging of breast: Secondary | ICD-10-CM

## 2020-06-07 ENCOUNTER — Ambulatory Visit
Admission: RE | Admit: 2020-06-07 | Discharge: 2020-06-07 | Disposition: A | Discharge status: Home / Self Care | Payer: Commercial Managed Care - PPO | Source: Ambulatory Visit | Attending: Obstetrics and Gynecology | Admitting: Obstetrics and Gynecology

## 2020-06-07 ENCOUNTER — Other Ambulatory Visit: Payer: Self-pay | Admitting: Obstetrics and Gynecology

## 2020-06-07 ENCOUNTER — Other Ambulatory Visit: Payer: Self-pay

## 2020-06-07 DIAGNOSIS — R921 Mammographic calcification found on diagnostic imaging of breast: Secondary | ICD-10-CM

## 2020-10-11 ENCOUNTER — Inpatient Hospital Stay: Admission: RE | Admit: 2020-10-11 | Payer: Commercial Managed Care - PPO | Source: Ambulatory Visit

## 2020-11-09 ENCOUNTER — Encounter: Payer: Self-pay | Admitting: Podiatry

## 2020-11-09 ENCOUNTER — Ambulatory Visit (INDEPENDENT_AMBULATORY_CARE_PROVIDER_SITE_OTHER): Payer: Commercial Managed Care - PPO

## 2020-11-09 ENCOUNTER — Ambulatory Visit (INDEPENDENT_AMBULATORY_CARE_PROVIDER_SITE_OTHER): Payer: Commercial Managed Care - PPO | Admitting: Podiatry

## 2020-11-09 ENCOUNTER — Other Ambulatory Visit: Payer: Self-pay

## 2020-11-09 DIAGNOSIS — M722 Plantar fascial fibromatosis: Secondary | ICD-10-CM

## 2020-11-09 MED ORDER — MELOXICAM 15 MG PO TABS: 15.0000 mg | ORAL_TABLET | Freq: Every day | ORAL | 3 refills | Status: DC

## 2020-11-09 MED ORDER — METHYLPREDNISOLONE 4 MG PO TBPK: ORAL_TABLET | ORAL | 0 refills | Status: DC

## 2020-11-09 NOTE — Progress Notes (Signed)
  Subjective:  Patient ID: Rhonda Crawford, female    DOB: 02-23-1971,  MRN: [id number] HPI Chief Complaint  Patient presents with  . Pain    LT bottom heel pain x 3 wks; 10/10 AM pain and 8/10 afternoon pain -no injury -pt states it feels like stepping on an ice stick -pt states she was dx'd with PF 16 yrs ago -w/ swelling and numbness and lateral foot pain Tx: IBU, call rolling and stretching    50 y.o. female presents with the above complaint.   ROS: Denies fever chills nausea vomiting muscle aches pains calf pain back pain chest pain shortness of breath.  History reviewed. No pertinent past medical history. Past Surgical History:  Procedure Laterality Date  . BREAST BIOPSY Right 2017  . BREAST BIOPSY Left 2013    Current Outpatient Medications:  .  meloxicam (MOBIC) 15 MG tablet, Take 1 tablet (15 mg total) by mouth daily., Disp: 30 tablet, Rfl: 3 .  methylPREDNISolone (MEDROL DOSEPAK) 4 MG TBPK tablet, 6 day dose pack - take as directed, Disp: 21 tablet, Rfl: 0 .  omeprazole (PRILOSEC) 20 MG capsule, Take by mouth., Disp: , Rfl:   Allergies  Allergen Reactions  . Lac Bovis Nausea And Vomiting   Review of Systems Objective:  There were no vitals filed for this visit.  General: Well developed, nourished, in no acute distress, alert and oriented x3   Dermatological: Skin is warm, dry and supple bilateral. Nails x 10 are well maintained; remaining integument appears unremarkable at this time. There are no open sores, no preulcerative lesions, no rash or signs of infection present.  Vascular: Dorsalis Pedis artery and Posterior Tibial artery pedal pulses are 2/4 bilateral with immedate capillary fill time. Pedal hair growth present. No varicosities and no lower extremity edema present bilateral.   Neruologic: Grossly intact via light touch bilateral. Vibratory intact via tuning fork bilateral. Protective threshold with Semmes Wienstein monofilament intact to all pedal sites  bilateral. Patellar and Achilles deep tendon reflexes 2+ bilateral. No Babinski or clonus noted bilateral.   Musculoskeletal: No gross boney pedal deformities bilateral. No pain, crepitus, or limitation noted with foot and ankle range of motion bilateral. Muscular strength 5/5 in all groups tested bilateral.  Pain on palpation medial calcaneal tubercle of the left heel.  She has no pain on medial lateral compression of the calcaneus.  Gait: Unassisted, Nonantalgic.    Radiographs:  Radiographs taken today demonstrate an osseously mature individual with soft tissue increase in density at the plantar fascial kidney insertion site.  There is one small area of intrafascial calcification indicative of an old tear.  Assessment & Plan:   Assessment: Plantar fasciitis left.  Plan: Discussed etiology pathology conservative versus surgical therapies.  At this point start her on methylprednisolone to be followed by meloxicam.  I injected her left heel today 20 mg Kenalog 5 mg Marcaine point of maximal tenderness.  Also placed her in a plantar fascial brace and a single night splint.  We discussed appropriate shoe gear stretching exercises ice therapy and shoe gear modifications.  I will follow-up with her in 1 month.     Chistopher Mangino T. [place], [place]

## 2020-11-09 NOTE — Patient Instructions (Signed)

## 2020-11-17 ENCOUNTER — Ambulatory Visit
Admission: RE | Admit: 2020-11-17 | Discharge: 2020-11-17 | Disposition: A | Discharge status: Home / Self Care | Payer: Commercial Managed Care - PPO | Source: Ambulatory Visit | Attending: Obstetrics and Gynecology | Admitting: Obstetrics and Gynecology

## 2020-11-17 ENCOUNTER — Other Ambulatory Visit: Payer: Self-pay

## 2020-11-17 DIAGNOSIS — R921 Mammographic calcification found on diagnostic imaging of breast: Secondary | ICD-10-CM

## 2020-12-12 ENCOUNTER — Ambulatory Visit: Payer: Commercial Managed Care - PPO | Admitting: Podiatry

## 2020-12-19 ENCOUNTER — Other Ambulatory Visit: Payer: Self-pay

## 2020-12-19 ENCOUNTER — Encounter: Payer: Self-pay | Admitting: Podiatry

## 2020-12-19 ENCOUNTER — Ambulatory Visit: Payer: Commercial Managed Care - PPO | Admitting: Podiatry

## 2020-12-19 DIAGNOSIS — M722 Plantar fascial fibromatosis: Secondary | ICD-10-CM | POA: Diagnosis not present

## 2020-12-19 MED ORDER — TRIAMCINOLONE ACETONIDE 40 MG/ML IJ SUSP
20.0000 mg | Freq: Once | INTRAMUSCULAR | Status: AC
  Administered 2020-12-19: 20 mg

## 2021-01-01 ENCOUNTER — Other Ambulatory Visit: Payer: Commercial Managed Care - PPO | Admitting: Orthotics

## 2021-10-27 ENCOUNTER — Ambulatory Visit: Payer: Self-pay

## 2021-11-05 ENCOUNTER — Other Ambulatory Visit: Payer: Self-pay

## 2021-11-05 ENCOUNTER — Ambulatory Visit
Admission: EM | Admit: 2021-11-05 | Discharge: 2021-11-05 | Disposition: A | Discharge status: Home / Self Care | Payer: Commercial Managed Care - PPO | Attending: Physician Assistant | Admitting: Physician Assistant

## 2021-11-05 DIAGNOSIS — J019 Acute sinusitis, unspecified: Secondary | ICD-10-CM | POA: Diagnosis not present

## 2021-11-05 MED ORDER — AMOXICILLIN-POT CLAVULANATE 875-125 MG PO TABS: 1.0000 | ORAL_TABLET | Freq: Two times a day (BID) | ORAL | 0 refills | Status: DC

## 2021-11-05 NOTE — ED Triage Notes (Signed)
Ten day h/o nasal congestion and two day h/o sinus pressure. Has been taking mucinex, Sudafed and other otc meds without relief. Patient complains of her head feeling heavy. Denies v/d, cough and body aches.

## 2021-11-05 NOTE — ED Provider Notes (Signed)
[facility]    CSN: [id number] Arrival date & time: 11/05/21  1146      History   Chief Complaint Chief Complaint  Patient presents with   Nasal Congestion    HPI Rhonda Crawford is a 51 y.o. female.   Patient here today for evaluation of sinus pressure and "heaviness" that started about 2 days ago. She reports about 10 days ago she developed some sinus congestion and drainage. She has not had fever. She denies any cough, body aches, nausea, vomiting or diarrhea. She has tried OTC meds without significant relief.   The history is provided by the patient.   History reviewed. No pertinent past medical history.  Patient Active Problem List   Diagnosis Date Noted   Class 1 obesity due to excess calories with serious comorbidity and body mass index (BMI) of 34.0 to 34.9 in adult 09/30/2018   Other fatigue 07/30/2018   Trouble swallowing 07/30/2018   Mixed hyperlipidemia 05/14/2018   Weight gain 05/14/2018    Past Surgical History:  Procedure Laterality Date   BREAST BIOPSY Right 2017   BREAST BIOPSY Left 2013    OB History   No obstetric history on file.      Home Medications    Prior to Admission medications   Medication Sig Start Date End Date Taking? Authorizing Provider  amoxicillin-clavulanate (AUGMENTIN) 875-125 MG tablet Take 1 tablet by mouth every 12 (twelve) hours. 11/05/21  Yes Tomi Bamberger, PA-C  meloxicam (MOBIC) 15 MG tablet Take 1 tablet (15 mg total) by mouth daily. 11/09/20   Hyatt, Max T, DPM  omeprazole (PRILOSEC) 20 MG capsule Take by mouth.    [provider]    Family History Family History  Problem Relation Age of Onset   Breast cancer Neg Hx     Social History Social History   Tobacco Use   Smoking status: Never   Smokeless tobacco: Never  Substance Use Topics   Alcohol use: Never   Drug use: Never     Allergies   Lac bovis   Review of Systems Review of Systems  Constitutional:  Negative for chills  and fever.  HENT:  Positive for congestion and sinus pressure. Negative for ear pain and sore throat.   Eyes:  Negative for discharge and redness.  Respiratory:  Negative for cough, shortness of breath and wheezing.   Gastrointestinal:  Negative for abdominal pain, diarrhea, nausea and vomiting.    Physical Exam Triage Vital Signs ED Triage Vitals [11/05/21 1304]  Enc Vitals Group     BP 138/84     Pulse Rate 61     Resp 18     Temp 97.7 F (36.5 C)     Temp Source Oral     SpO2 97 %     Weight      Height      Head Circumference      Peak Flow      Pain Score      Pain Loc      Pain Edu?      Excl. in GC?    No data found.  Updated Vital Signs BP 138/84 (BP Location: Left Arm)    Pulse 61    Temp 97.7 F (36.5 C) (Oral)    Resp 18    SpO2 97%     Physical Exam Vitals and nursing note reviewed.  Constitutional:      General: She is not in acute distress.  Appearance: Normal appearance. She is not ill-appearing.  HENT:     Head: Normocephalic and atraumatic.     Nose: Congestion present.     Mouth/Throat:     Mouth: Mucous membranes are moist.     Pharynx: No oropharyngeal exudate or posterior oropharyngeal erythema.  Eyes:     Conjunctiva/sclera: Conjunctivae normal.  Cardiovascular:     Rate and Rhythm: Normal rate and regular rhythm.     Heart sounds: Normal heart sounds. No murmur heard. Pulmonary:     Effort: Pulmonary effort is normal. No respiratory distress.     Breath sounds: Normal breath sounds. No wheezing, rhonchi or rales.  Skin:    General: Skin is warm and dry.  Neurological:     Mental Status: She is alert.  Psychiatric:        Mood and Affect: Mood normal.        Thought Content: Thought content normal.     UC Treatments / Results  Labs (all labs ordered are listed, but only abnormal results are displayed) Labs Reviewed - No data to display  EKG   Radiology No results found.  Procedures Procedures (including critical care  time)  Medications Ordered in UC Medications - No data to display  Initial Impression / Assessment and Plan / UC Course  I have reviewed the triage vital signs and the nursing notes.  Pertinent labs & imaging results that were available during my care of the patient were reviewed by me and considered in my medical decision making (see chart for details).    Suspect acute sinusitis. Will treat with augmentin and recommended follow up with any further concerns.  Final Clinical Impressions(s) / UC Diagnoses   Final diagnoses:  Acute sinusitis, recurrence not specified, unspecified location   Discharge Instructions   None    ED Prescriptions     Medication Sig Dispense Auth. Provider   amoxicillin-clavulanate (AUGMENTIN) 875-125 MG tablet Take 1 tablet by mouth every 12 (twelve) hours. 14 tablet Francene Finders, PA-C      PDMP not reviewed this encounter.   Francene Finders, PA-C 11/05/21 1450

## 2021-12-18 ENCOUNTER — Other Ambulatory Visit: Payer: Self-pay

## 2021-12-18 ENCOUNTER — Ambulatory Visit
Admission: RE | Admit: 2021-12-18 | Discharge: 2021-12-18 | Disposition: A | Discharge status: Home / Self Care | Payer: Commercial Managed Care - PPO | Source: Ambulatory Visit | Attending: Physician Assistant | Admitting: Physician Assistant

## 2021-12-18 VITALS — BP 147/85 | HR 67 | Temp 97.9°F | Resp 18

## 2021-12-18 DIAGNOSIS — J069 Acute upper respiratory infection, unspecified: Secondary | ICD-10-CM | POA: Diagnosis not present

## 2021-12-18 NOTE — ED Provider Notes (Signed)
[facility]    CSN: [id number] Arrival date & time: 12/18/21  1153      History   Chief Complaint Chief Complaint  Patient presents with   Cough   Appointment    1200    HPI Rhonda Crawford is a 51 y.o. female.   Patient here today for evaluation of cough congestion she had the last 4 days.  She reports she did have fever initially.  She has not had any nausea, vomiting or diarrhea.  She does not report treatment for symptoms.  The history is provided by the patient.  Cough Associated symptoms: no chills, no ear pain, no eye discharge, no fever, no shortness of breath, no sore throat and no wheezing    History reviewed. No pertinent past medical history.  Patient Active Problem List   Diagnosis Date Noted   Class 1 obesity due to excess calories with serious comorbidity and body mass index (BMI) of 34.0 to 34.9 in adult 09/30/2018   Other fatigue 07/30/2018   Trouble swallowing 07/30/2018   Mixed hyperlipidemia 05/14/2018   Weight gain 05/14/2018    Past Surgical History:  Procedure Laterality Date   BREAST BIOPSY Right 2017   BREAST BIOPSY Left 2013    OB History   No obstetric history on file.      Home Medications    Prior to Admission medications   Medication Sig Start Date End Date Taking? Authorizing Provider  amoxicillin-clavulanate (AUGMENTIN) 875-125 MG tablet Take 1 tablet by mouth every 12 (twelve) hours. Patient not taking: Reported on 12/18/2021 11/05/21   Tomi Bamberger, PA-C  meloxicam (MOBIC) 15 MG tablet Take 1 tablet (15 mg total) by mouth daily. 11/09/20   Hyatt, Max T, DPM  omeprazole (PRILOSEC) 20 MG capsule Take by mouth.    [provider]    Family History Family History  Problem Relation Age of Onset   Breast cancer Neg Hx     Social History Social History   Tobacco Use   Smoking status: Never   Smokeless tobacco: Never  Substance Use Topics   Alcohol use: Never   Drug use: Never     Allergies    Lac bovis   Review of Systems Review of Systems  Constitutional:  Negative for chills and fever.  HENT:  Positive for congestion. Negative for ear pain and sore throat.   Eyes:  Negative for discharge and redness.  Respiratory:  Positive for cough. Negative for shortness of breath and wheezing.   Gastrointestinal:  Negative for abdominal pain, diarrhea, nausea and vomiting.    Physical Exam Triage Vital Signs ED Triage Vitals [12/18/21 1215]  Enc Vitals Group     BP (!) 147/85     Pulse Rate 67     Resp 18     Temp 97.9 F (36.6 C)     Temp Source Oral     SpO2 95 %     Weight      Height      Head Circumference      Peak Flow      Pain Score 5     Pain Loc      Pain Edu?      Excl. in GC?    No data found.  Updated Vital Signs BP (!) 147/85 (BP Location: Left Arm)    Pulse 67    Temp 97.9 F (36.6 C) (Oral)    Resp 18    SpO2 95%  Physical Exam Vitals and nursing note reviewed.  Constitutional:      General: She is not in acute distress.    Appearance: Normal appearance. She is not ill-appearing.  HENT:     Head: Normocephalic and atraumatic.     Right Ear: Tympanic membrane normal.     Left Ear: Tympanic membrane normal.     Nose: Congestion present.     Mouth/Throat:     Mouth: Mucous membranes are moist.     Pharynx: No oropharyngeal exudate or posterior oropharyngeal erythema.  Eyes:     Conjunctiva/sclera: Conjunctivae normal.  Cardiovascular:     Rate and Rhythm: Normal rate and regular rhythm.     Heart sounds: Normal heart sounds. No murmur heard. Pulmonary:     Effort: Pulmonary effort is normal. No respiratory distress.     Breath sounds: Normal breath sounds. No wheezing, rhonchi or rales.  Skin:    General: Skin is warm and dry.  Neurological:     Mental Status: She is alert.  Psychiatric:        Mood and Affect: Mood normal.        Thought Content: Thought content normal.     UC Treatments / Results  Labs (all labs ordered  are listed, but only abnormal results are displayed) Labs Reviewed  COVID-19, FLU A+B NAA    EKG   Radiology No results found.  Procedures Procedures (including critical care time)  Medications Ordered in UC Medications - No data to display  Initial Impression / Assessment and Plan / UC Course  I have reviewed the triage vital signs and the nursing notes.  Pertinent labs & imaging results that were available during my care of the patient were reviewed by me and considered in my medical decision making (see chart for details).  Suspect likely viral etiology of symptoms.  Will order COVID screening and encouraged symptomatic treatment, increase fluids and rest.  Encouraged follow-up with any further concerns.  Final Clinical Impressions(s) / UC Diagnoses   Final diagnoses:  Acute upper respiratory infection   Discharge Instructions   None    ED Prescriptions   None    PDMP not reviewed this encounter.   Tomi Bamberger, PA-C 12/18/21 1251

## 2021-12-18 NOTE — ED Triage Notes (Signed)
Pt here for cough and congestion x 4 days; pt sts some fever

## 2021-12-19 LAB — COVID-19, FLU A+B NAA
Influenza A, NAA: NOT DETECTED
Influenza B, NAA: NOT DETECTED
SARS-CoV-2, NAA: NOT DETECTED

## 2022-03-22 ENCOUNTER — Ambulatory Visit (INDEPENDENT_AMBULATORY_CARE_PROVIDER_SITE_OTHER): Payer: Commercial Managed Care - PPO

## 2022-03-22 ENCOUNTER — Ambulatory Visit
Admission: RE | Admit: 2022-03-22 | Discharge: 2022-03-22 | Disposition: A | Discharge status: Home / Self Care | Payer: Commercial Managed Care - PPO | Source: Ambulatory Visit | Attending: Internal Medicine | Admitting: Internal Medicine

## 2022-03-22 VITALS — BP 138/90 | HR 62 | Temp 97.6°F | Resp 18

## 2022-03-22 DIAGNOSIS — R053 Chronic cough: Secondary | ICD-10-CM

## 2022-03-22 DIAGNOSIS — J3489 Other specified disorders of nose and nasal sinuses: Secondary | ICD-10-CM | POA: Diagnosis not present

## 2022-03-22 DIAGNOSIS — R0981 Nasal congestion: Secondary | ICD-10-CM | POA: Diagnosis not present

## 2022-03-22 DIAGNOSIS — R059 Cough, unspecified: Secondary | ICD-10-CM | POA: Diagnosis not present

## 2022-03-22 HISTORY — DX: Gastro-esophageal reflux disease without esophagitis: K21.9

## 2022-03-22 MED ORDER — DOXYCYCLINE HYCLATE 100 MG PO CAPS
100.0000 mg | ORAL_CAPSULE | Freq: Two times a day (BID) | ORAL | 0 refills | Status: DC
Start: 2022-03-22 — End: 2024-02-20

## 2022-03-22 MED ORDER — PREDNISONE 10 MG (21) PO TBPK: ORAL_TABLET | Freq: Every day | ORAL | 0 refills | Status: DC

## 2022-03-22 NOTE — ED Triage Notes (Signed)
Pt c/o cough, sinus pressure, onset ~ several months ago. States since Tuesday it has been getting worse. Associated night sweats. States she has been seen numerous times for this problem.

## 2022-03-22 NOTE — ED Provider Notes (Signed)
[facility]    CSN: [id number] Arrival date & time: 03/22/22  1649      History   Chief Complaint Chief Complaint  Patient presents with   Fever    CoughSinus pressureDrainageHad cough for months- was seen for it awhile ago and hasn't gone away - Entered by patient   Cough    HPI Rhonda Crawford is a 51 y.o. female.   Patient presents with a cough, sinus pressure, nasal drainage, nasal congestion that has been present for approximately 5 months.  Patient reports that symptoms will improved but then will come back even worse.  She reports that her cough has been productive and has worsened over the past few days.  She has also felt feverish but has not had a documented fever.  Patient was recently seen in January 2023 when symptoms first started and treated with Augmentin antibiotic with no improvement.  She reports that she has been taking several over-the-counter cough and cold medications with no improvement in symptoms.  Denies history of asthma or COPD and patient is not a smoker.   Fever Cough  Past Medical History:  Diagnosis Date   GERD (gastroesophageal reflux disease)     Patient Active Problem List   Diagnosis Date Noted   Class 1 obesity due to excess calories with serious comorbidity and body mass index (BMI) of 34.0 to 34.9 in adult 09/30/2018   Other fatigue 07/30/2018   Trouble swallowing 07/30/2018   Mixed hyperlipidemia 05/14/2018   Weight gain 05/14/2018    Past Surgical History:  Procedure Laterality Date   BREAST BIOPSY Right 2017   BREAST BIOPSY Left 2013    OB History   No obstetric history on file.      Home Medications    Prior to Admission medications   Medication Sig Start Date End Date Taking? Authorizing Provider  doxycycline (VIBRAMYCIN) 100 MG capsule Take 1 capsule (100 mg total) by mouth 2 (two) times daily. 03/22/22  Yes Marya Lowden, Rolly Salter E, FNP  predniSONE (STERAPRED UNI-PAK 21 TAB) 10 MG (21) TBPK tablet Take by  mouth daily. Take 6 tabs by mouth daily  for 2 days, then 5 tabs for 2 days, then 4 tabs for 2 days, then 3 tabs for 2 days, 2 tabs for 2 days, then 1 tab by mouth daily for 2 days 03/22/22  Yes Maleiah Dula, Ardoch E, FNP  meloxicam (MOBIC) 15 MG tablet Take 1 tablet (15 mg total) by mouth daily. 11/09/20   Hyatt, Max T, DPM  omeprazole (PRILOSEC) 20 MG capsule Take by mouth.    [provider]    Family History Family History  Problem Relation Age of Onset   Breast cancer Neg Hx     Social History Social History   Tobacco Use   Smoking status: Never   Smokeless tobacco: Never  Substance Use Topics   Alcohol use: Never   Drug use: Never     Allergies   Lac bovis   Review of Systems Review of Systems Per HPI  Physical Exam Triage Vital Signs ED Triage Vitals  Enc Vitals Group     BP 03/22/22 1757 138/90     Pulse Rate 03/22/22 1757 62     Resp 03/22/22 1757 18     Temp 03/22/22 1757 97.6 F (36.4 C)     Temp Source 03/22/22 1757 Oral     SpO2 03/22/22 1757 95 %     Weight --  Height --      Head Circumference --      Peak Flow --      Pain Score 03/22/22 1801 0     Pain Loc --      Pain Edu? --      Excl. in GC? --    No data found.  Updated Vital Signs BP 138/90 (BP Location: Right Arm)   Pulse 62   Temp 97.6 F (36.4 C) (Oral)   Resp 18   SpO2 95%   Visual Acuity Right Eye Distance:   Left Eye Distance:   Bilateral Distance:    Right Eye Near:   Left Eye Near:    Bilateral Near:     Physical Exam Constitutional:      General: She is not in acute distress.    Appearance: Normal appearance. She is not toxic-appearing or diaphoretic.  HENT:     Head: Normocephalic and atraumatic.     Right Ear: Tympanic membrane and ear canal normal.     Left Ear: Tympanic membrane and ear canal normal.     Nose: Congestion present.     Mouth/Throat:     Mouth: Mucous membranes are moist.     Pharynx: No posterior oropharyngeal erythema.  Eyes:      Extraocular Movements: Extraocular movements intact.     Conjunctiva/sclera: Conjunctivae normal.     Pupils: Pupils are equal, round, and reactive to light.  Cardiovascular:     Rate and Rhythm: Normal rate and regular rhythm.     Pulses: Normal pulses.     Heart sounds: Normal heart sounds.  Pulmonary:     Effort: Pulmonary effort is normal. No respiratory distress.     Breath sounds: Normal breath sounds. No stridor. No wheezing, rhonchi or rales.  Abdominal:     General: Abdomen is flat. Bowel sounds are normal.     Palpations: Abdomen is soft.  Musculoskeletal:        General: Normal range of motion.     Cervical back: Normal range of motion.  Skin:    General: Skin is warm and dry.  Neurological:     General: No focal deficit present.     Mental Status: She is alert and oriented to person, place, and time. Mental status is at baseline.  Psychiatric:        Mood and Affect: Mood normal.        Behavior: Behavior normal.     UC Treatments / Results  Labs (all labs ordered are listed, but only abnormal results are displayed) Labs Reviewed - No data to display  EKG   Radiology DG Chest 2 View  Result Date: 03/22/2022 CLINICAL DATA:  Cough EXAM: CHEST - 2 VIEW COMPARISON:  None Available. FINDINGS: Cardiac and mediastinal contours are within normal limits. No focal pulmonary opacity. No pleural effusion or pneumothorax. No acute osseous abnormality. IMPRESSION: No acute cardiopulmonary process. Electronically Signed   By: Wiliam KeAlison  Vasan M.D.   On: 03/22/2022 18:32    Procedures Procedures (including critical care time)  Medications Ordered in UC Medications - No data to display  Initial Impression / Assessment and Plan / UC Course  I have reviewed the triage vital signs and the nursing notes.  Pertinent labs & imaging results that were available during my care of the patient were reviewed by me and considered in my medical decision making (see chart for details).      Chest x-ray was negative for any acute cardiopulmonary  process.  Suspect persistent sinus infection and sinus related issues.  Will treat with doxycycline and prednisone steroid to alleviate inflammation and any type of bacterial infection.  No obvious contraindications to prednisone noted on exam or after review of chart.  She has taken prednisone in the past and tolerated well.  I do think that patient needs to see ENT specialty given persistent symptoms.  Provided with contact information for ENT and patient advised to follow-up.  Discussed return precautions.  Patient verbalized understanding and was agreeable with plan. Final Clinical Impressions(s) / UC Diagnoses   Final diagnoses:  Sinus pressure  Nasal congestion  Persistent cough for 3 weeks or longer     Discharge Instructions      Your chest x-ray was normal.  You have been prescribed antibiotic and prednisone steroid to alleviate sinus pressure, cough, symptoms.  Please follow-up with ear, nose, throat specialist on Monday for further evaluation and management.     ED Prescriptions     Medication Sig Dispense Auth. Provider   doxycycline (VIBRAMYCIN) 100 MG capsule Take 1 capsule (100 mg total) by mouth 2 (two) times daily. 20 capsule [place], [place], [place]   predniSONE (STERAPRED UNI-PAK 21 TAB) 10 MG (21) TBPK tablet Take by mouth daily. Take 6 tabs by mouth daily  for 2 days, then 5 tabs for 2 days, then 4 tabs for 2 days, then 3 tabs for 2 days, 2 tabs for 2 days, then 1 tab by mouth daily for 2 days 42 tablet [place], [place], [place]      PDMP not reviewed this encounter.   Gustavus Bryant, [place] 03/22/22 [phone]

## 2022-03-22 NOTE — Discharge Instructions (Signed)
Your chest x-ray was normal.  You have been prescribed antibiotic and prednisone steroid to alleviate sinus pressure, cough, symptoms.  Please follow-up with ear, nose, throat specialist on Monday for further evaluation and management.

## 2022-05-15 ENCOUNTER — Other Ambulatory Visit: Payer: Self-pay | Admitting: Obstetrics and Gynecology

## 2022-05-15 DIAGNOSIS — Z09 Encounter for follow-up examination after completed treatment for conditions other than malignant neoplasm: Secondary | ICD-10-CM

## 2022-05-15 DIAGNOSIS — R921 Mammographic calcification found on diagnostic imaging of breast: Secondary | ICD-10-CM

## 2022-05-29 ENCOUNTER — Ambulatory Visit
Admission: RE | Admit: 2022-05-29 | Discharge: 2022-05-29 | Disposition: A | Discharge status: Home / Self Care | Payer: Commercial Managed Care - PPO | Source: Ambulatory Visit | Attending: Obstetrics and Gynecology | Admitting: Obstetrics and Gynecology

## 2022-05-29 DIAGNOSIS — R921 Mammographic calcification found on diagnostic imaging of breast: Secondary | ICD-10-CM

## 2022-05-29 DIAGNOSIS — Z09 Encounter for follow-up examination after completed treatment for conditions other than malignant neoplasm: Secondary | ICD-10-CM

## 2022-11-26 IMAGING — MG DIGITAL DIAGNOSTIC BILAT W/ TOMO W/ CAD
6 of 10 series · 6 of 26 positions shown · non-contrast
Comparison: Previous exams including diagnostic mammograms dated
06/07/2020 and 10/27/2019.

CLINICAL DATA: Recall from screening mammogram in Saturday October, 2019
for calcifications in the upper-outer quadrant of the RIGHT breast
at posterior depth. Prior benign stereotactic biopsy of
calcifications in the upper-outer RIGHT breast with pathology
revealing fibrocystic changes and associated calcifications.

EXAM:
DIGITAL DIAGNOSTIC BILATERAL MAMMOGRAM WITH TOMO AND CAD

[R ML]
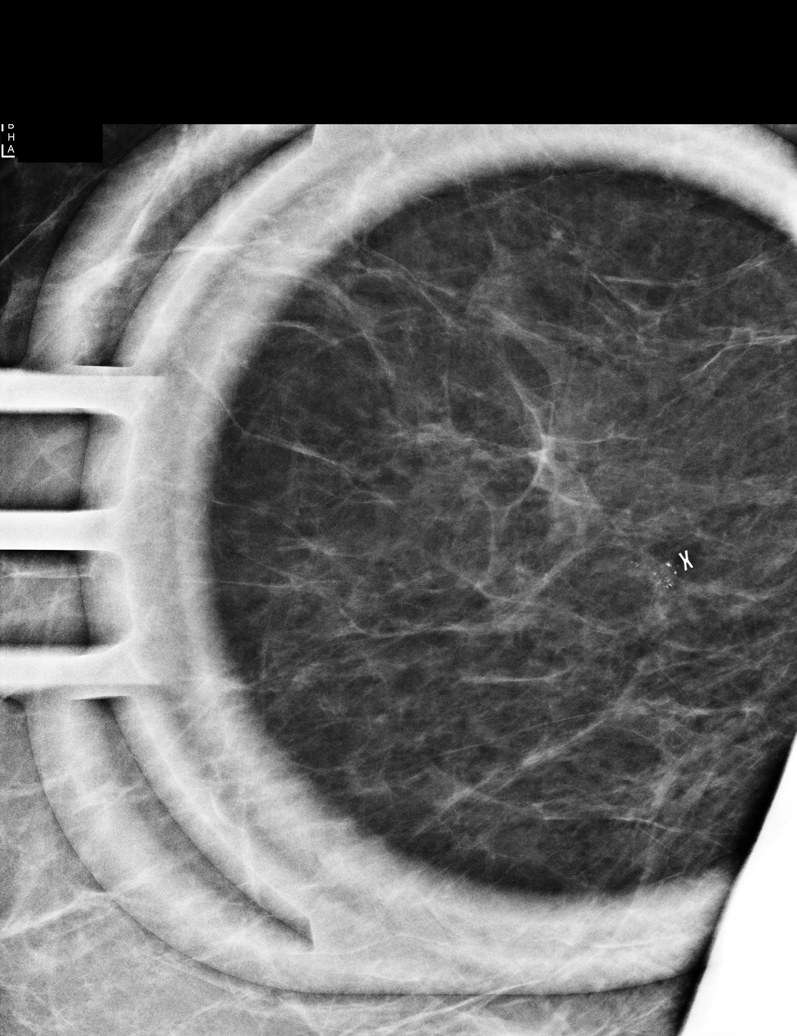

[R CC]
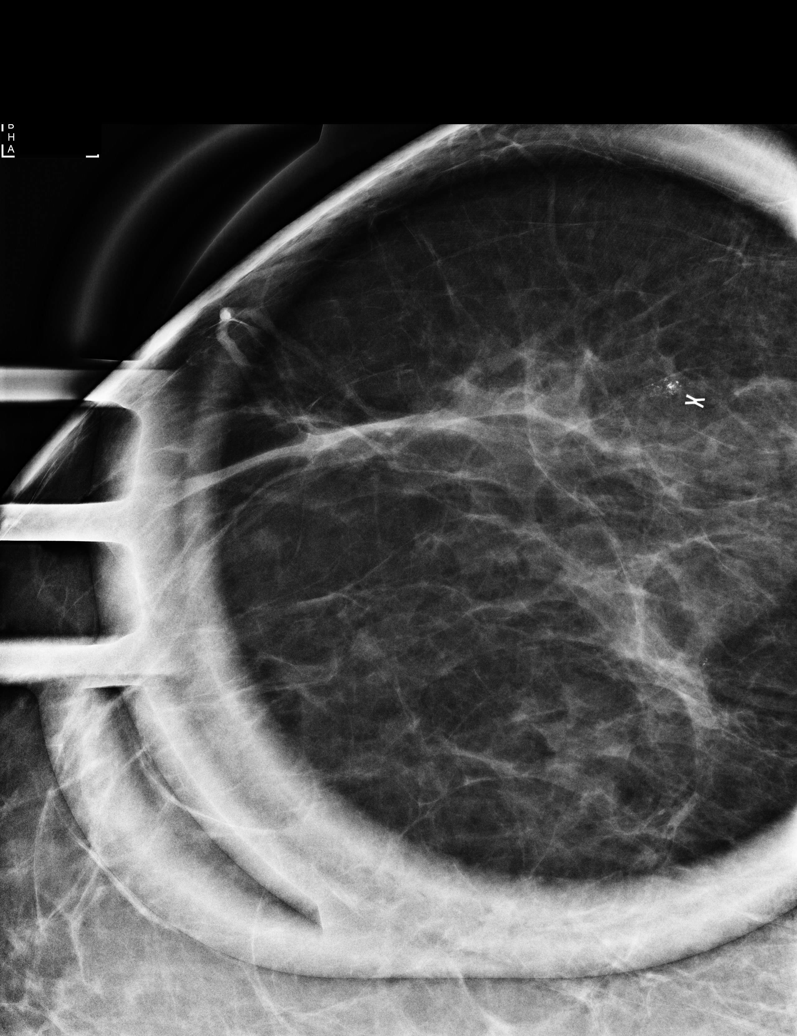

[R CC synth-2D]
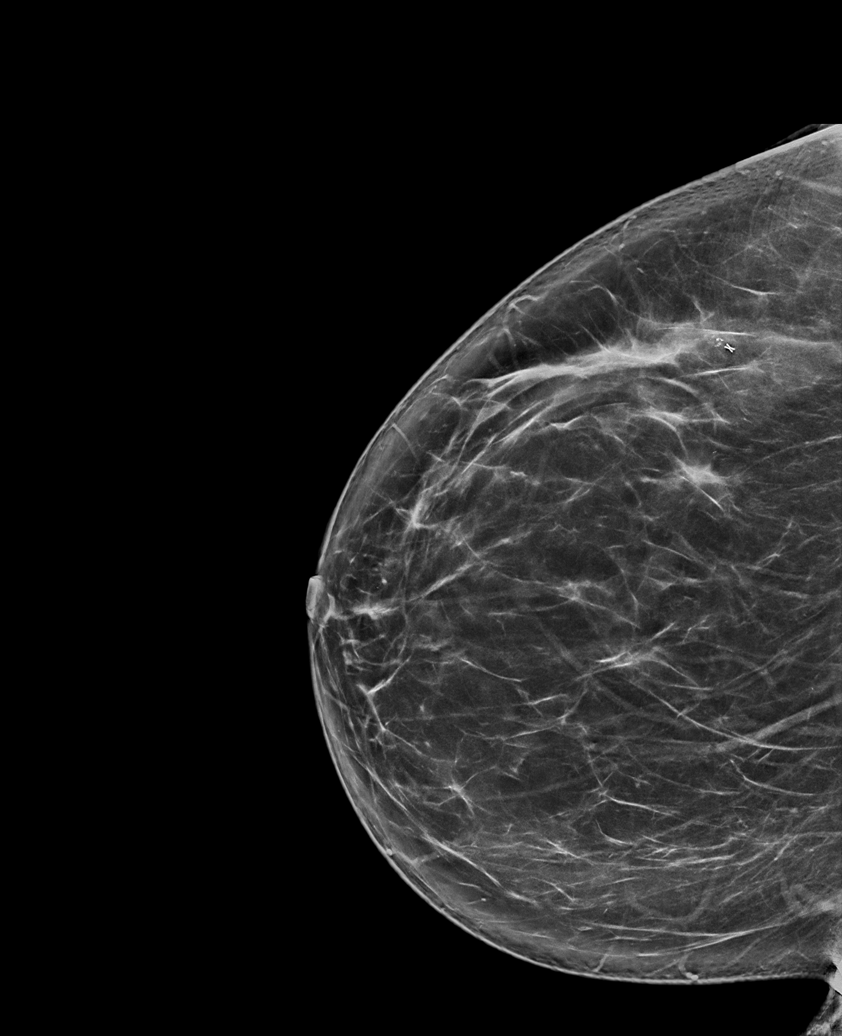

[R MLO synth-2D]
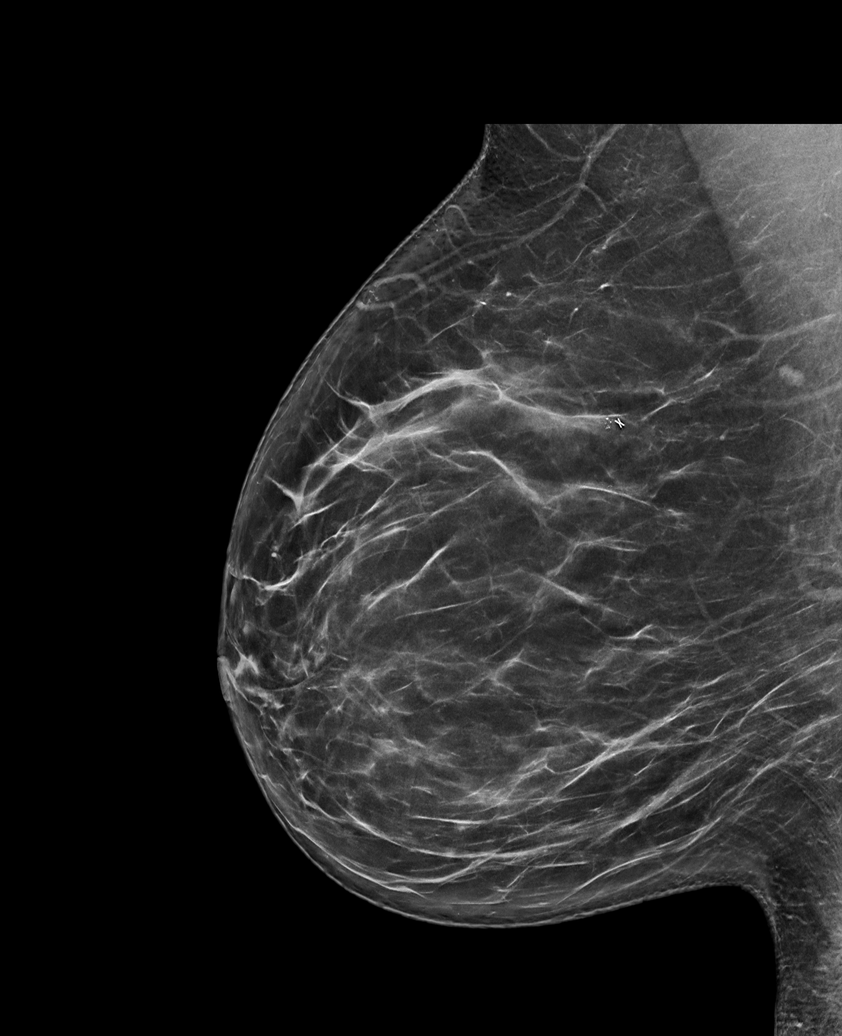

[L MLO synth-2D]
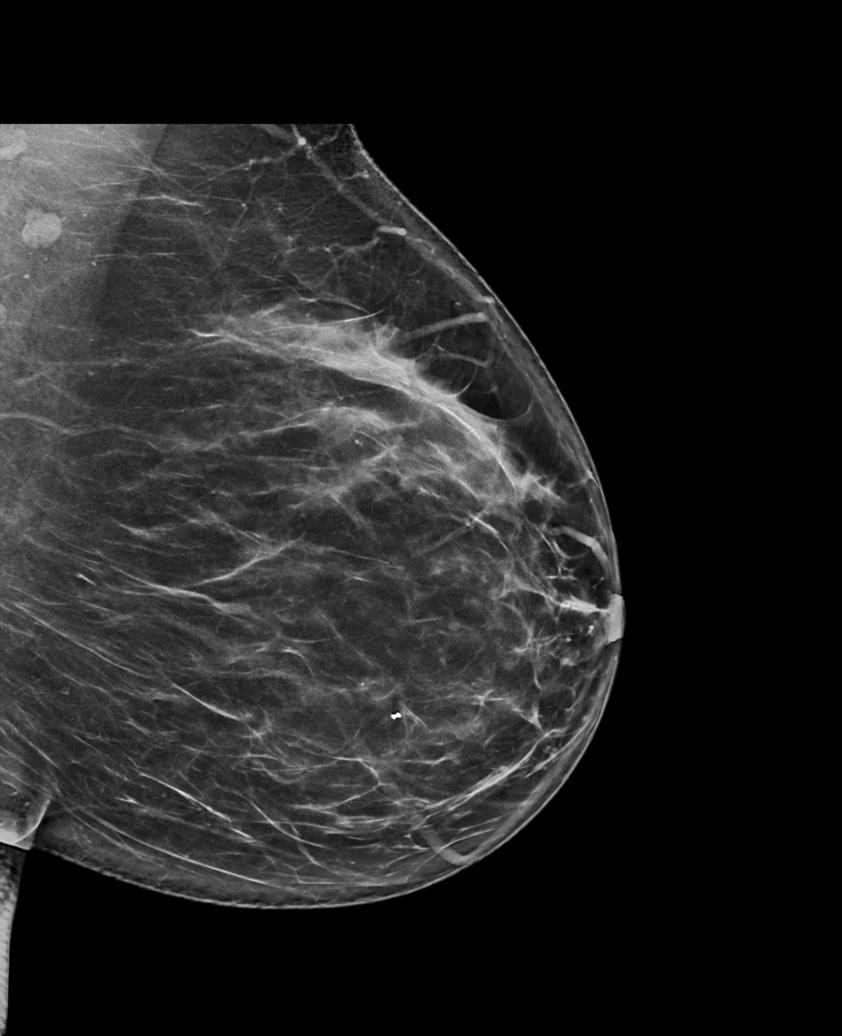

[L CC synth-2D]
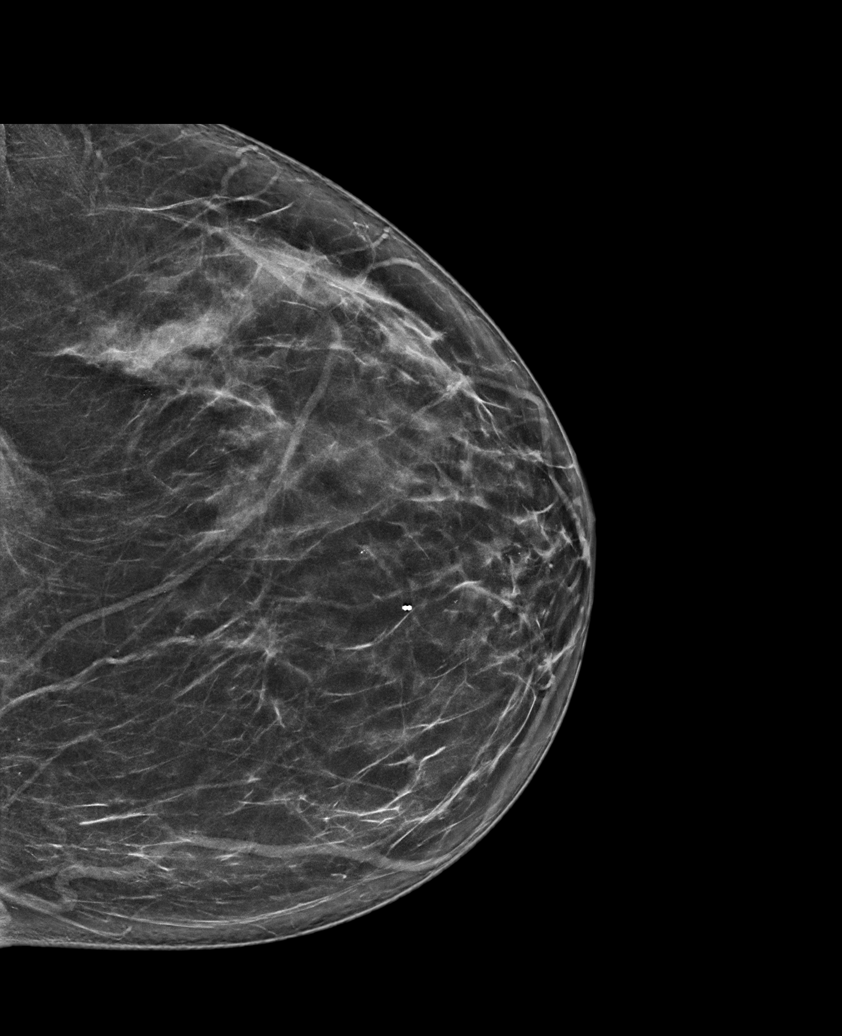

[6 of 26 positions shown; findings below may reference images not displayed]

ACR Breast Density Category b: There are scattered areas of
fibroglandular density.
FINDINGS: The RIGHT breast calcifications are not significantly changed, with
adjacent X shaped biopsy clip. There are no new dominant masses,
suspicious calcifications or secondary signs of malignancy within
either breast.

Mammographic images were processed with CAD.
IMPRESSION: 1. Probably benign calcifications within the upper-outer quadrant of
the RIGHT breast, at posterior depth, with adjacent X shaped biopsy
clip corresponding to an earlier benign biopsy site.
2. No evidence of malignancy within the LEFT breast.

RECOMMENDATION:
Bilateral diagnostic mammogram, with RIGHT breast magnification
views, in 12 months.

I have discussed the findings and recommendations with the patient.
If applicable, a reminder letter will be sent to the patient
regarding the next appointment.

BI-RADS CATEGORY  3: Probably benign.

## 2023-10-24 ENCOUNTER — Other Ambulatory Visit: Payer: Self-pay | Admitting: Endocrinology

## 2023-10-24 DIAGNOSIS — Z1231 Encounter for screening mammogram for malignant neoplasm of breast: Secondary | ICD-10-CM

## 2023-11-14 ENCOUNTER — Ambulatory Visit: Payer: Commercial Managed Care - PPO

## 2023-11-17 ENCOUNTER — Ambulatory Visit
Admission: RE | Admit: 2023-11-17 | Discharge: 2023-11-17 | Disposition: A | Discharge status: Home / Self Care | Payer: Commercial Managed Care - PPO | Source: Ambulatory Visit | Attending: Endocrinology | Admitting: Endocrinology

## 2023-11-17 DIAGNOSIS — Z1231 Encounter for screening mammogram for malignant neoplasm of breast: Secondary | ICD-10-CM

## 2023-11-19 ENCOUNTER — Other Ambulatory Visit: Payer: Self-pay | Admitting: Endocrinology

## 2023-11-19 DIAGNOSIS — R928 Other abnormal and inconclusive findings on diagnostic imaging of breast: Secondary | ICD-10-CM

## 2023-11-27 ENCOUNTER — Ambulatory Visit: Payer: Commercial Managed Care - PPO

## 2023-11-27 ENCOUNTER — Ambulatory Visit
Admission: RE | Admit: 2023-11-27 | Discharge: 2023-11-27 | Disposition: A | Discharge status: Home / Self Care | Payer: Commercial Managed Care - PPO | Source: Ambulatory Visit | Attending: Endocrinology | Admitting: Endocrinology

## 2023-11-27 DIAGNOSIS — R928 Other abnormal and inconclusive findings on diagnostic imaging of breast: Secondary | ICD-10-CM

## 2024-02-20 ENCOUNTER — Ambulatory Visit
Admission: RE | Admit: 2024-02-20 | Discharge: 2024-02-20 | Disposition: A | Discharge status: Home / Self Care | Source: Ambulatory Visit

## 2024-02-20 VITALS — BP 143/88 | HR 76 | Temp 98.5°F | Resp 16

## 2024-02-20 DIAGNOSIS — J069 Acute upper respiratory infection, unspecified: Secondary | ICD-10-CM

## 2024-02-20 DIAGNOSIS — R194 Change in bowel habit: Secondary | ICD-10-CM | POA: Insufficient documentation

## 2024-02-20 DIAGNOSIS — R053 Chronic cough: Secondary | ICD-10-CM | POA: Diagnosis not present

## 2024-02-20 DIAGNOSIS — R0781 Pleurodynia: Secondary | ICD-10-CM | POA: Insufficient documentation

## 2024-02-20 DIAGNOSIS — R1032 Left lower quadrant pain: Secondary | ICD-10-CM | POA: Insufficient documentation

## 2024-02-20 MED ORDER — BENZONATATE 100 MG PO CAPS: 100.0000 mg | ORAL_CAPSULE | Freq: Three times a day (TID) | ORAL | 0 refills | Status: DC | PRN

## 2024-02-20 MED ORDER — AMOXICILLIN-POT CLAVULANATE 875-125 MG PO TABS: 1.0000 | ORAL_TABLET | Freq: Two times a day (BID) | ORAL | 0 refills | Status: DC

## 2024-02-20 MED ORDER — PREDNISONE 10 MG (21) PO TBPK: ORAL_TABLET | Freq: Every day | ORAL | 0 refills | Status: DC

## 2024-02-20 NOTE — ED Triage Notes (Signed)
 Pt c/o cough x 2 weeks. She has treated it with Sudafed and Mucinex with no improvement in sxs. Pt denies emesis and diarrhea.

## 2024-02-20 NOTE — Discharge Instructions (Addendum)
 I have prescribe an antibiotic and steroid to take for symptoms.  Please take with food to avoid stomach upset and follow-up if any symptoms persist or worsen.

## 2024-02-20 NOTE — ED Provider Notes (Signed)
 [facility]    CSN: [id number] Arrival date & time: 02/20/24  1532      History   Chief Complaint Chief Complaint  Patient presents with   Cough    Congestion and cough . 2 weeks - Entered by patient    HPI Rhonda Crawford is a 53 y.o. female.   Patient presents with cough that started 2 weeks ago.  Reports it is mainly dry but productive at times.  Also reports nasal congestion but denies sinus pressure.  Reports that she has felt feverish but has not taken temperature with thermometer.  Denies any known sick contacts.  This is the first time she has been evaluated since symptoms started.  Has taken Sudafed and Mucinex with no improvement in symptoms.  She is not reporting any chest pain or shortness of breath.   Cough   Past Medical History:  Diagnosis Date   GERD (gastroesophageal reflux disease)     Patient Active Problem List   Diagnosis Date Noted   Change in bowel habit 02/20/2024   Left lower quadrant pain 02/20/2024   Rib pain 02/20/2024   Class 1 obesity due to excess calories with serious comorbidity and body mass index (BMI) of 34.0 to 34.9 in adult 09/30/2018   Other fatigue 07/30/2018   Trouble swallowing 07/30/2018   Mixed hyperlipidemia 05/14/2018   Weight gain 05/14/2018    Past Surgical History:  Procedure Laterality Date   BREAST BIOPSY Right 2017   BREAST BIOPSY Left 2013    OB History   No obstetric history on file.      Home Medications    Prior to Admission medications   Medication Sig Start Date End Date Taking? Authorizing Provider  amoxicillin -clavulanate (AUGMENTIN ) 875-125 MG tablet Take 1 tablet by mouth every 12 (twelve) hours. 02/20/24  Yes Rahmon Heigl, [place]y [place] E, FNP  atorvastatin (LIPITOR) 20 MG tablet Take 20 mg by mouth daily. 02/06/24  Yes [provider]  benzonatate  (TESSALON ) 100 MG capsule Take 1 capsule (100 mg total) by mouth every 8 (eight) hours as needed for cough. 02/20/24  Yes Chayne Baumgart, Fairy [place] E, FNP   estradiol (VIVELLE-DOT) 0.075 MG/24HR Place 1 patch onto the skin 2 (two) times a week. 01/28/24  Yes [provider]  predniSONE  (STERAPRED UNI-PAK 21 TAB) 10 MG (21) TBPK tablet Take by mouth daily. Take 6 tabs by mouth daily  for 2 days, then 5 tabs for 2 days, then 4 tabs for 2 days, then 3 tabs for 2 days, 2 tabs for 2 days, then 1 tab by mouth daily for 2 days 02/20/24  Yes Dain Laseter, [place] E, FNP  progesterone (PROMETRIUM) 100 MG capsule Take 100 mg by mouth daily. 01/30/24  Yes [provider]  ZEPBOUND 2.5 MG/0.5ML Pen SMARTSIG:2.5 Milligram(s) SUB-Q Once a Week 11/25/23  Yes [provider]  ZEPBOUND 5 MG/0.5ML Pen SMARTSIG:5 Milligram(s) SUB-Q Once a Week 02/03/24  Yes [provider]  meloxicam  (MOBIC ) 15 MG tablet Take 1 tablet (15 mg total) by mouth daily. 11/09/20   Hyatt, Max T, DPM  omeprazole (PRILOSEC) 20 MG capsule Take by mouth.    [provider]    Family History Family History  Problem Relation Age of Onset   Breast cancer Neg Hx     Social History Social History   Tobacco Use   Smoking status: Never    Passive exposure: Never   Smokeless tobacco: Never  Vaping Use   Vaping status: Never Used  Substance  Use Topics   Alcohol use: Never   Drug use: Never     Allergies   Milk (cow)   Review of Systems Review of Systems Per HPI  Physical Exam Triage Vital Signs ED Triage Vitals  Encounter Vitals Group     BP 02/20/24 1601 (!) 143/88     Systolic BP Percentile --      Diastolic BP Percentile --      Pulse Rate 02/20/24 1601 76     Resp 02/20/24 1601 16     Temp 02/20/24 1601 98.5 F (36.9 C)     Temp Source 02/20/24 1601 Oral     SpO2 02/20/24 1601 95 %     Weight --      Height --      Head Circumference --      Peak Flow --      Pain Score 02/20/24 1559 2     Pain Loc --      Pain Education --      Exclude from Growth Chart --    No data found.  Updated Vital Signs BP (!) 143/88 (BP Location: Left  Arm)   Pulse 76   Temp 98.5 F (36.9 C) (Oral)   Resp 16   LMP  (LMP Unknown)   SpO2 95%   Visual Acuity Right Eye Distance:   Left Eye Distance:   Bilateral Distance:    Right Eye Near:   Left Eye Near:    Bilateral Near:     Physical Exam Constitutional:      General: She is not in acute distress.    Appearance: Normal appearance. She is not toxic-appearing or diaphoretic.  HENT:     Head: Normocephalic and atraumatic.     Right Ear: Tympanic membrane and ear canal normal.     Left Ear: Tympanic membrane and ear canal normal.     Nose: Congestion present.     Mouth/Throat:     Mouth: Mucous membranes are moist.     Pharynx: No posterior oropharyngeal erythema.  Eyes:     Extraocular Movements: Extraocular movements intact.     Conjunctiva/sclera: Conjunctivae normal.     Pupils: Pupils are equal, round, and reactive to light.  Cardiovascular:     Rate and Rhythm: Normal rate and regular rhythm.     Pulses: Normal pulses.     Heart sounds: Normal heart sounds.  Pulmonary:     Effort: Pulmonary effort is normal. No respiratory distress.     Breath sounds: Normal breath sounds. No stridor. No wheezing, rhonchi or rales.  Musculoskeletal:        General: Normal range of motion.     Cervical back: Normal range of motion.  Skin:    General: Skin is warm and dry.  Neurological:     General: No focal deficit present.     Mental Status: She is alert and oriented to person, place, and time. Mental status is at baseline.  Psychiatric:        Mood and Affect: Mood normal.        Behavior: Behavior normal.      UC Treatments / Results  Labs (all labs ordered are listed, but only abnormal results are displayed) Labs Reviewed - No data to display  EKG   Radiology No results found.  Procedures Procedures (including critical care time)  Medications Ordered in UC Medications - No data to display  Initial Impression / Assessment and Plan / UC Course  I have  reviewed the triage vital signs and the nursing notes.  Pertinent labs & imaging results that were available during my care of the patient were reviewed by me and considered in my medical decision making (see chart for details).     Offered patient chest imaging but she declined with shared decision making which I do think is reasonable given there are no adventitious lung sounds on exam, no tachypnea, and oxygen is normal.  I am most suspicious of acute bronchitis.  Will treat with prednisone  steroid taper, Augmentin  given duration of symptoms to cover for secondary bacterial infection, and cough medication.  Advised patient to follow-up if any symptoms persist or worsen.  Patient verbalized understanding and was agreeable with plan. Final Clinical Impressions(s) / UC Diagnoses   Final diagnoses:  Persistent cough  Acute upper respiratory infection     Discharge Instructions      I have prescribe an antibiotic and steroid to take for symptoms.  Please take with food to avoid stomach upset and follow-up if any symptoms persist or worsen.    ED Prescriptions     Medication Sig Dispense Auth. Provider   amoxicillin -clavulanate (AUGMENTIN ) 875-125 MG tablet Take 1 tablet by mouth every 12 (twelve) hours. 14 tablet [place], Ayvion Kavanagh E, FNP   predniSONE  (STERAPRED UNI-PAK 21 TAB) 10 MG (21) TBPK tablet Take by mouth daily. Take 6 tabs by mouth daily  for 2 days, then 5 tabs for 2 days, then 4 tabs for 2 days, then 3 tabs for 2 days, 2 tabs for 2 days, then 1 tab by mouth daily for 2 days 42 tablet [place], [place] E, [place]   benzonatate  (TESSALON ) 100 MG capsule Take 1 capsule (100 mg total) by mouth every 8 (eight) hours as needed for cough. 21 capsule [place], Bria Sparr E, [place]      PDMP not reviewed this encounter.   Dodson [place], [place] 02/20/24 [phone]

## 2024-03-30 IMAGING — DX DG CHEST 2V
2 series · 2 of 2 positions shown · non-contrast
Comparison: None Available.

CLINICAL DATA: Cough

EXAM:
CHEST - 2 VIEW

[chest pa]
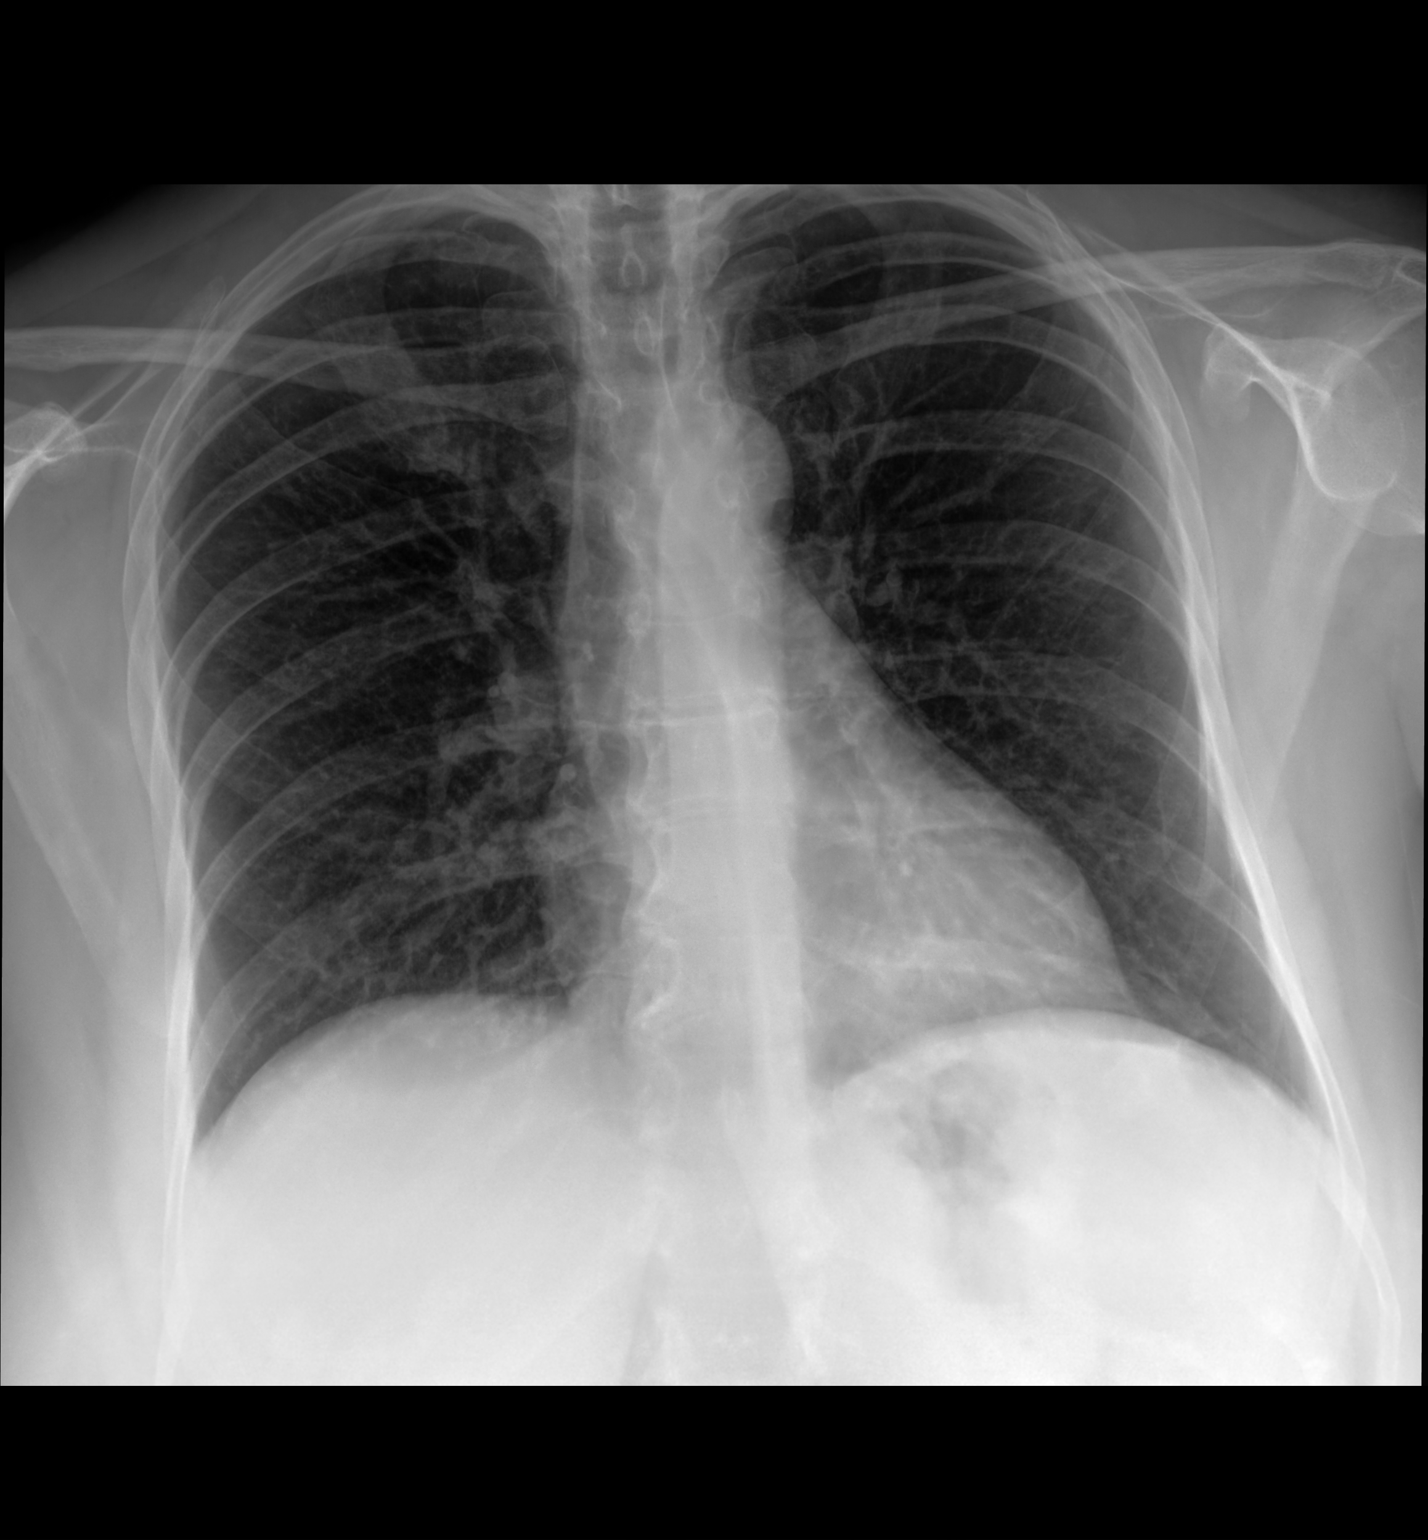

[chest lat]
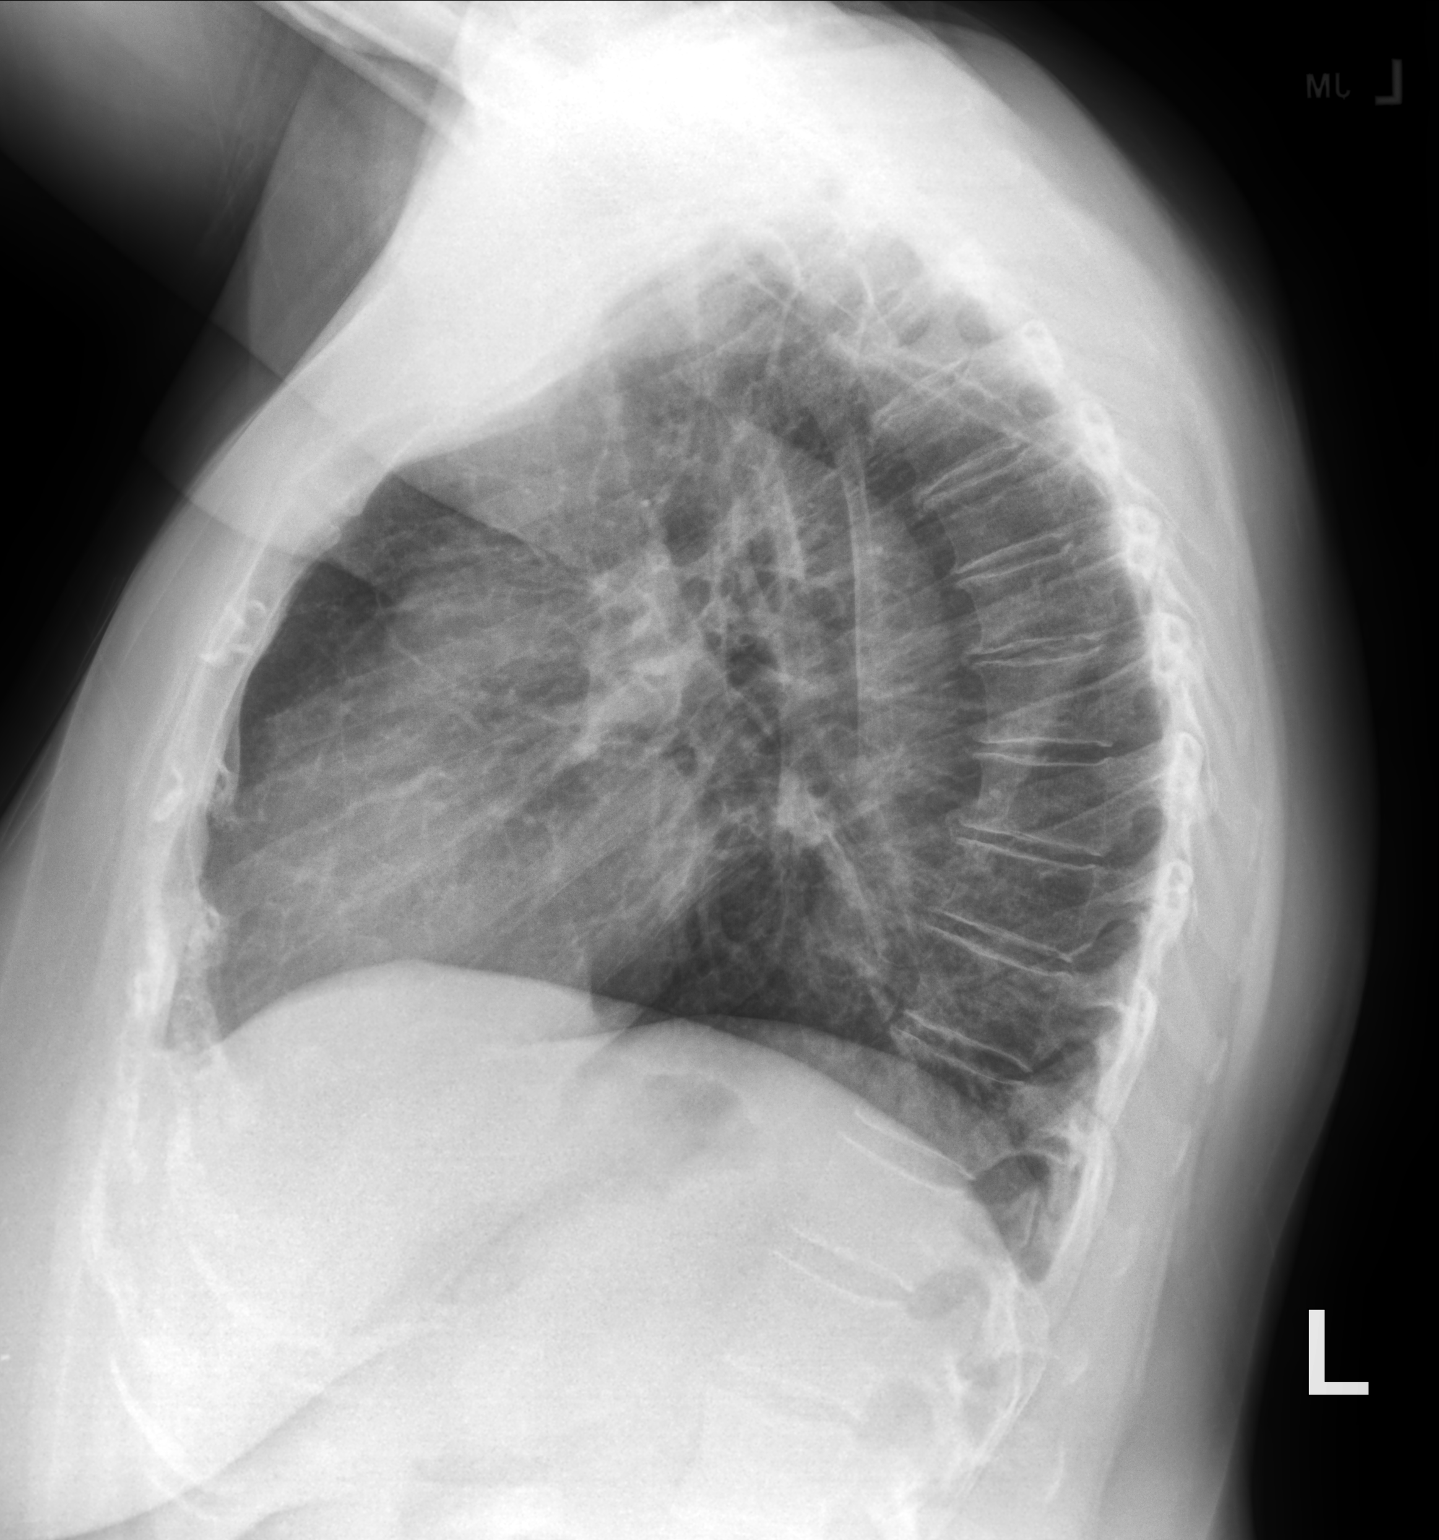

[2 of 2 positions shown; findings below may reference images not displayed]

FINDINGS: Cardiac and mediastinal contours are within normal limits. No focal
pulmonary opacity. No pleural effusion or pneumothorax. No acute
osseous abnormality.
IMPRESSION: No acute cardiopulmonary process.

## 2024-06-22 ENCOUNTER — Encounter: Payer: Self-pay | Admitting: Podiatry

## 2024-06-22 ENCOUNTER — Ambulatory Visit (INDEPENDENT_AMBULATORY_CARE_PROVIDER_SITE_OTHER)

## 2024-06-22 ENCOUNTER — Ambulatory Visit: Admitting: Podiatry

## 2024-06-22 DIAGNOSIS — M722 Plantar fascial fibromatosis: Secondary | ICD-10-CM | POA: Diagnosis not present

## 2024-06-22 MED ORDER — MELOXICAM 15 MG PO TABS: 15.0000 mg | ORAL_TABLET | Freq: Every day | ORAL | 3 refills | Status: DC

## 2024-06-22 MED ORDER — TRIAMCINOLONE ACETONIDE 40 MG/ML IJ SUSP
20.0000 mg | Freq: Once | INTRAMUSCULAR | Status: AC
  Administered 2024-06-22: 20 mg

## 2024-06-22 MED ORDER — METHYLPREDNISOLONE 4 MG PO TBPK: ORAL_TABLET | ORAL | 0 refills | Status: DC

## 2024-06-22 NOTE — Progress Notes (Signed)
 Subjective:  Patient ID: Rhonda Crawford, female    DOB: May 10, 1971,  MRN: [id number] HPI Chief Complaint  Patient presents with   Foot Pain    Flare of PF right - aching again x 3-4 weeks, thinks she wore a pair of shoes she shouldn't have worn, been wearing night splint, tried stretching   New Patient (Initial Visit)    Est pt 2022    53 y.o. female presents with the above complaint.   ROS: Denies fever chills nausea mobic  muscle aches pains calf pain back pain chest pain shortness of breath.  Past Medical History:  Diagnosis Date   GERD (gastroesophageal reflux disease)    Past Surgical History:  Procedure Laterality Date   BREAST BIOPSY Right 2017   BREAST BIOPSY Left 2013    Current Outpatient Medications:    meloxicam  (MOBIC ) 15 MG tablet, Take 1 tablet (15 mg total) by mouth daily., Disp: 30 tablet, Rfl: 3   methylPREDNISolone  (MEDROL  DOSEPAK) 4 MG TBPK tablet, 6 day dose pack - take as directed, Disp: 21 tablet, Rfl: 0   atorvastatin (LIPITOR) 20 MG tablet, Take 20 mg by mouth daily., Disp: , Rfl:    estradiol (VIVELLE-DOT) 0.075 MG/24HR, Place 1 patch onto the skin 2 (two) times a week., Disp: , Rfl:    omeprazole (PRILOSEC) 20 MG capsule, Take by mouth., Disp: , Rfl:    progesterone (PROMETRIUM) 100 MG capsule, Take 100 mg by mouth daily., Disp: , Rfl:    ZEPBOUND 5 MG/0.5ML Pen, SMARTSIG:5 Milligram(s) SUB-Q Once a Week, Disp: , Rfl:   Allergies  Allergen Reactions   Milk (Cow) Nausea And Vomiting and Nausea Only    Other Reaction(s): GI Intolerance   Review of Systems Objective:  There were no vitals filed for this visit.  General: Well developed, nourished, in no acute distress, alert and oriented x3   Dermatological: Skin is warm, dry and supple bilateral. Nails x 10 are well maintained; remaining integument appears unremarkable at this time. There are no open sores, no preulcerative lesions, no rash or signs of infection present.  Vascular: Dorsalis  Pedis artery and Posterior Tibial artery pedal pulses are 2/4 bilateral with immedate capillary fill time. Pedal hair growth present. No varicosities and no lower extremity edema present bilateral.   Neruologic: Grossly intact via light touch bilateral. Vibratory intact via tuning fork bilateral. Protective threshold with Semmes Wienstein monofilament intact to all pedal sites bilateral. Patellar and Achilles deep tendon reflexes 2+ bilateral. No Babinski or clonus noted bilateral.   Musculoskeletal: No gross boney pedal deformities bilateral. No pain, crepitus, or limitation noted with foot and ankle range of motion bilateral. Muscular strength 5/5 in all groups tested bilateral.  Pain on palpation medial continue tubercle of the right heel.  No pain on medial-lateral compression of the calcaneus and no pain on palpation of the Achilles tendon.  Gait: Unassisted, Nonantalgic.    Radiographs:  Radiographs taken today demonstrate osseously mature individual with good bone mineralization.  She does have posterior proximally oriented calcaneal heel spur to the right heel.  She does have considerable amount of soft tissue increase in density at the plantar fascial calcaneal insertion site.  She also has some calcification within the plantar fascia consistent with a possible old tear.  Assessment & Plan:   Assessment: Plan fasciitis right foot.  Plan: Injected the right heel today 20 mg Kenalog  5 mg Marcaine point maximal tenderness.  Toller procedure well without complications.  Was given both oral and  home-going stretches for shoe gear.  I started her on methylprednisolone  to be followed by meloxicam  she has her night splint at home and I will follow-up with her in 6 weeks.  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     Calise Dunckel [place], [place]

## 2024-08-03 ENCOUNTER — Encounter: Payer: Self-pay | Admitting: Podiatry

## 2024-08-03 ENCOUNTER — Ambulatory Visit: Admitting: Podiatry

## 2024-08-03 DIAGNOSIS — M722 Plantar fascial fibromatosis: Secondary | ICD-10-CM | POA: Diagnosis not present

## 2024-08-03 MED ORDER — TRIAMCINOLONE ACETONIDE 40 MG/ML IJ SUSP
20.0000 mg | Freq: Once | INTRAMUSCULAR | Status: AC
  Administered 2024-08-03: 20 mg

## 2024-08-03 NOTE — Progress Notes (Signed)
 She presents today for follow-up of her plantar fasciitis right foot she says to have good days and bad days still wearing the boot at night and taking the meloxicam .  I tend to get cramps.  Objective: Vital signs are stable alert oriented x 3 directly on the plantar aspect of the right heel there is a palpable mass either bursitis or a neuroma such as Baxters neuroma or Baxters neuritis.  Assessment: Plantar fasciitis with bursitis.  Plan: We injected the area today with Kenalog  and local anesthetic.  We will follow-up with her once she returns from [place].  I explained to her that I would give her another injection if necessary.  After that an MRI will be necessary.

## 2024-09-21 ENCOUNTER — Ambulatory Visit: Admitting: Podiatry

## 2024-09-21 ENCOUNTER — Encounter: Payer: Self-pay | Admitting: Podiatry

## 2024-09-21 DIAGNOSIS — S93691A Other sprain of right foot, initial encounter: Secondary | ICD-10-CM

## 2024-09-21 DIAGNOSIS — M722 Plantar fascial fibromatosis: Secondary | ICD-10-CM

## 2024-09-21 MED ORDER — TRIAMCINOLONE ACETONIDE 40 MG/ML IJ SUSP
20.0000 mg | Freq: Once | INTRAMUSCULAR | Status: AC
  Administered 2024-09-21: 20 mg

## 2024-09-21 NOTE — Progress Notes (Signed)
 Presents today with a chief concern rapidly reoccurring plantar fasciitis.  Mom states that it had gotten better for a while but then come back just is painful if more painful than it was he was started to develop some symptoms in the contralateral foot.  Objective: Vital signs stable alert and oriented x 3.  Pulses are palpable.  On palpation it appears that she has a small tear in the medial band of the plantar fascia near its insertion site on the calcaneus or plantar fibroma in that area.  Palpable mass nonpulsatile is present there.  Assessment probable tear of the plantar fascia or plantar fibroma right foot.  Either way chronic plantar fascial pain.  Plan: We injected the area today with 1 to inject right into the fibromatous part of this lesion.  With 10 mg of Kenalog  and 5 mg of Marcaine.  We are going to request an MRI of this right heel just for evaluation make sure this is a fibroma or tear this will be for surgical consideration and differential diagnoses.

## 2024-10-25 ENCOUNTER — Other Ambulatory Visit: Payer: Self-pay | Admitting: Podiatry

## 2024-10-26 ENCOUNTER — Encounter: Payer: Self-pay | Admitting: Podiatry

## 2024-11-02 ENCOUNTER — Other Ambulatory Visit: Payer: Self-pay | Admitting: Obstetrics & Gynecology

## 2024-11-02 ENCOUNTER — Ambulatory Visit
Admission: RE | Admit: 2024-11-02 | Discharge: 2024-11-02 | Disposition: A | Discharge status: Home / Self Care | Source: Ambulatory Visit | Attending: Podiatry | Admitting: Podiatry

## 2024-11-02 DIAGNOSIS — S93691A Other sprain of right foot, initial encounter: Secondary | ICD-10-CM

## 2024-11-02 DIAGNOSIS — Z1231 Encounter for screening mammogram for malignant neoplasm of breast: Secondary | ICD-10-CM

## 2024-11-23 ENCOUNTER — Ambulatory Visit: Payer: Self-pay | Admitting: Podiatry

## 2024-11-29 ENCOUNTER — Ambulatory Visit

## 2024-12-06 ENCOUNTER — Ambulatory Visit

## 2024-12-08 ENCOUNTER — Ambulatory Visit: Admission: RE | Admit: 2024-12-08 | Discharge: 2024-12-08 | Disposition: A | Source: Ambulatory Visit

## 2024-12-08 DIAGNOSIS — Z1231 Encounter for screening mammogram for malignant neoplasm of breast: Secondary | ICD-10-CM
